# Patient Record
Sex: Female | Born: 1965 | Race: White | Hispanic: No | Marital: Married | Smoking: Never smoker
Health system: Southern US, Community
[De-identification: ages and names within clinical notes are randomized; demographics above are authoritative.]

## PROBLEM LIST (undated history)

## (undated) DIAGNOSIS — T7840XA Allergy, unspecified, initial encounter: Secondary | ICD-10-CM

## (undated) DIAGNOSIS — E785 Hyperlipidemia, unspecified: Secondary | ICD-10-CM

## (undated) DIAGNOSIS — E236 Other disorders of pituitary gland: Secondary | ICD-10-CM

## (undated) DIAGNOSIS — G43909 Migraine, unspecified, not intractable, without status migrainosus: Secondary | ICD-10-CM

## (undated) DIAGNOSIS — J45909 Unspecified asthma, uncomplicated: Secondary | ICD-10-CM

## (undated) HISTORY — PX: ROTATOR CUFF REPAIR: SHX139

## (undated) HISTORY — DX: Unspecified asthma, uncomplicated: J45.909

## (undated) HISTORY — DX: Allergy, unspecified, initial encounter: T78.40XA

## (undated) HISTORY — DX: Hyperlipidemia, unspecified: E78.5

## (undated) HISTORY — PX: APPENDECTOMY: SHX54

## (undated) HISTORY — PX: EYE SURGERY: SHX253

## (undated) HISTORY — PX: ABDOMINAL HYSTERECTOMY: SHX81

---
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
## 2004-01-23 ENCOUNTER — Other Ambulatory Visit: Payer: Self-pay

## 2004-07-30 ENCOUNTER — Ambulatory Visit: Payer: Self-pay | Admitting: Endocrinology

## 2006-04-03 ENCOUNTER — Ambulatory Visit: Payer: Self-pay | Admitting: Family Medicine

## 2006-04-03 LAB — HM DEXA SCAN

## 2006-07-30 ENCOUNTER — Ambulatory Visit: Payer: Self-pay | Admitting: General Surgery

## 2007-04-09 ENCOUNTER — Ambulatory Visit: Payer: Self-pay | Admitting: Family Medicine

## 2008-04-12 ENCOUNTER — Ambulatory Visit: Payer: Self-pay

## 2008-08-16 ENCOUNTER — Ambulatory Visit: Payer: Self-pay | Admitting: Unknown Physician Specialty

## 2009-07-06 ENCOUNTER — Ambulatory Visit: Payer: Self-pay | Admitting: Family Medicine

## 2010-07-18 ENCOUNTER — Ambulatory Visit: Payer: Self-pay | Admitting: Family Medicine

## 2011-02-18 ENCOUNTER — Ambulatory Visit: Payer: Self-pay | Admitting: General Practice

## 2011-04-28 ENCOUNTER — Observation Stay: Payer: Self-pay | Admitting: Internal Medicine

## 2011-04-28 DIAGNOSIS — G459 Transient cerebral ischemic attack, unspecified: Secondary | ICD-10-CM

## 2012-05-19 ENCOUNTER — Ambulatory Visit: Payer: Self-pay | Admitting: Family Medicine

## 2012-05-19 LAB — HM MAMMOGRAPHY

## 2012-06-04 LAB — HM PAP SMEAR

## 2014-12-12 ENCOUNTER — Encounter: Payer: Self-pay | Admitting: Emergency Medicine

## 2014-12-12 ENCOUNTER — Emergency Department
Admission: EM | Admit: 2014-12-12 | Discharge: 2014-12-12 | Disposition: A | Discharge status: Home / Self Care | Payer: No Typology Code available for payment source | Attending: Emergency Medicine | Admitting: Emergency Medicine

## 2014-12-12 ENCOUNTER — Emergency Department: Payer: No Typology Code available for payment source

## 2014-12-12 DIAGNOSIS — R079 Chest pain, unspecified: Secondary | ICD-10-CM | POA: Insufficient documentation

## 2014-12-12 DIAGNOSIS — Z792 Long term (current) use of antibiotics: Secondary | ICD-10-CM | POA: Diagnosis not present

## 2014-12-12 DIAGNOSIS — R202 Paresthesia of skin: Secondary | ICD-10-CM | POA: Diagnosis not present

## 2014-12-12 DIAGNOSIS — R51 Headache: Secondary | ICD-10-CM | POA: Diagnosis not present

## 2014-12-12 DIAGNOSIS — R519 Headache, unspecified: Secondary | ICD-10-CM

## 2014-12-12 DIAGNOSIS — R110 Nausea: Secondary | ICD-10-CM | POA: Insufficient documentation

## 2014-12-12 DIAGNOSIS — R002 Palpitations: Secondary | ICD-10-CM | POA: Insufficient documentation

## 2014-12-12 DIAGNOSIS — Z79899 Other long term (current) drug therapy: Secondary | ICD-10-CM | POA: Insufficient documentation

## 2014-12-12 HISTORY — DX: Migraine, unspecified, not intractable, without status migrainosus: G43.909

## 2014-12-12 HISTORY — DX: Other disorders of pituitary gland: E23.6

## 2014-12-12 LAB — CBC
HCT: 42.4 % (ref 35.0–47.0)
Hemoglobin: 14.2 g/dL (ref 12.0–16.0)
MCH: 28.5 pg (ref 26.0–34.0)
MCHC: 33.5 g/dL (ref 32.0–36.0)
MCV: 85 fL (ref 80.0–100.0)
Platelets: 241 10*3/uL (ref 150–440)
RBC: 4.99 MIL/uL (ref 3.80–5.20)
RDW: 13.3 % (ref 11.5–14.5)
WBC: 7 10*3/uL (ref 3.6–11.0)

## 2014-12-12 LAB — BASIC METABOLIC PANEL
Anion gap: 6 (ref 5–15)
BUN: 19 mg/dL (ref 6–20)
CALCIUM: 9.2 mg/dL (ref 8.9–10.3)
CO2: 24 mmol/L (ref 22–32)
CREATININE: 0.94 mg/dL (ref 0.44–1.00)
Chloride: 109 mmol/L (ref 101–111)
GFR calc non Af Amer: >60 mL/min (ref >60)
GLUCOSE: 114 mg/dL — AB (ref 65–99)
Potassium: 3.4 mmol/L — ABNORMAL LOW (ref 3.5–5.1)
Sodium: 139 mmol/L (ref 135–145)

## 2014-12-12 LAB — MAGNESIUM: Magnesium: 2 mg/dL (ref 1.7–2.4)

## 2014-12-12 LAB — TROPONIN I: Troponin I: <0.03 ng/mL (ref <0.031)

## 2014-12-12 MED ORDER — BUTALBITAL-APAP-CAFFEINE 50-325-40 MG PO TABS
2.0000 | ORAL_TABLET | Freq: Once | ORAL | Status: AC
  Administered 2014-12-12: 2 via ORAL

## 2014-12-12 MED ORDER — SODIUM CHLORIDE 0.9 % IV BOLUS (SEPSIS)
1000.0000 mL | Freq: Once | INTRAVENOUS | Status: AC
  Administered 2014-12-12: 1000 mL via INTRAVENOUS

## 2014-12-12 MED ORDER — METOCLOPRAMIDE HCL 5 MG/ML IJ SOLN
INTRAMUSCULAR | Status: AC
  Administered 2014-12-12: 10 mg via INTRAVENOUS
  Filled 2014-12-12: qty 2

## 2014-12-12 MED ORDER — BUTALBITAL-APAP-CAFFEINE 50-325-40 MG PO TABS
ORAL_TABLET | ORAL | Status: AC
  Administered 2014-12-12: 2 via ORAL
  Filled 2014-12-12: qty 2

## 2014-12-12 MED ORDER — METOCLOPRAMIDE HCL 5 MG/ML IJ SOLN
10.0000 mg | Freq: Once | INTRAMUSCULAR | Status: AC
  Administered 2014-12-12: 10 mg via INTRAVENOUS

## 2014-12-12 NOTE — ED Notes (Signed)
Pt uprite on stretcher in exam room with no distress noted; pt reports lying in bed approx 2hrs PTA and had sudden onset heart racing, SOB, nausea, tingling to hands, "burning" sensation to mid chest radiating up into throat; denies hx of same; reports recent air trip to [place] and began having ear pain for which she was rx amoxicillin for on Thursday

## 2014-12-12 NOTE — ED Notes (Addendum)
Med admin for nausea as ordered & per pt request for c/o; reports HA now decreases 4/10

## 2014-12-12 NOTE — ED Notes (Signed)
Pt says about 2 hours ago she was laying in bed when she began having palpitations, nausea, hand tingling; diaphoretic but not at this time; pt says "I just don't feel well"; returned from vacation today; taking antibiotics for ear infection;

## 2014-12-12 NOTE — Discharge Instructions (Signed)
General Headache Without Cause A headache is pain or discomfort felt around the head or neck area. The specific cause of a headache may not be found. There are many causes and types of headaches. A few common ones are:  Tension headaches.  Migraine headaches.  Cluster headaches.  Chronic daily headaches. HOME CARE INSTRUCTIONS   Keep all follow-up appointments with your caregiver or any specialist referral.  Only take over-the-counter or prescription medicines for pain or discomfort as directed by your caregiver.  Lie down in a dark, quiet room when you have a headache.  Keep a headache journal to find out what may trigger your migraine headaches. For example, write down:  What you eat and drink.  How much sleep you get.  Any change to your diet or medicines.  Try massage or other relaxation techniques.  Put ice packs or heat on the head and neck. Use these 3 to 4 times per day for 15 to 20 minutes each time, or as needed.  Limit stress.  Sit up straight, and do not tense your muscles.  Quit smoking if you smoke.  Limit alcohol use.  Decrease the amount of caffeine you drink, or stop drinking caffeine.  Eat and sleep on a regular schedule.  Get 7 to 9 hours of sleep, or as recommended by your caregiver.  Keep lights dim if bright lights bother you and make your headaches worse. SEEK MEDICAL CARE IF:   You have problems with the medicines you were prescribed.  Your medicines are not working.  You have a change from the usual headache.  You have nausea or vomiting. SEEK IMMEDIATE MEDICAL CARE IF:   Your headache becomes severe.  You have a fever.  You have a stiff neck.  You have loss of vision.  You have muscular weakness or loss of muscle control.  You start losing your balance or have trouble walking.  You feel faint or pass out.  You have severe symptoms that are different from your first symptoms. MAKE SURE YOU:   Understand these  instructions.  Will watch your condition.  Will get help right away if you are not doing well or get worse. Document Released: 06/10/2005 Document Revised: 09/02/2011 Document Reviewed: 06/26/2011 [facility] Patient Information 2015 [place], [place]. This information is not intended to replace advice given to you by your health care provider. Make sure you discuss any questions you have with your health care provider.  Palpitations A palpitation is the feeling that your heartbeat is irregular or is faster than normal. It may feel like your heart is fluttering or skipping a beat. Palpitations are usually not a serious problem. However, in some cases, you may need further medical evaluation. CAUSES  Palpitations can be caused by:  Smoking.  Caffeine or other stimulants, such as diet pills or energy drinks.  Alcohol.  Stress and anxiety.  Strenuous physical activity.  Fatigue.  Certain medicines.  Heart disease, especially if you have a history of irregular heart rhythms (arrhythmias), such as atrial fibrillation, atrial flutter, or supraventricular tachycardia.  An improperly working pacemaker or defibrillator. DIAGNOSIS  To find the cause of your palpitations, your health care provider will take your medical history and perform a physical exam. Your health care provider may also have you take a test called an ambulatory electrocardiogram (ECG). An ECG records your heartbeat patterns over a 24-hour period. You may also have other tests, such as:  Transthoracic echocardiogram (TTE). During echocardiography, sound waves are used to evaluate  how blood flows through your heart.  Transesophageal echocardiogram (TEE).  Cardiac monitoring. This allows your health care provider to monitor your heart rate and rhythm in real time.  Holter monitor. This is a portable device that records your heartbeat and can help diagnose heart arrhythmias. It allows your health care provider to track your  heart activity for several days, if needed.  Stress tests by exercise or by giving medicine that makes the heart beat faster. TREATMENT  Treatment of palpitations depends on the cause of your symptoms and can vary greatly. Most cases of palpitations do not require any treatment other than time, relaxation, and monitoring your symptoms. Other causes, such as atrial fibrillation, atrial flutter, or supraventricular tachycardia, usually require further treatment. HOME CARE INSTRUCTIONS   Avoid:  Caffeinated coffee, tea, soft drinks, diet pills, and energy drinks.  Chocolate.  Alcohol.  Stop smoking if you smoke.  Reduce your stress and anxiety. Things that can help you relax include:  A method of controlling things in your body, such as your heartbeats, with your mind (biofeedback).  Yoga.  Meditation.  Physical activity such as swimming, jogging, or walking.  Get plenty of rest and sleep. SEEK MEDICAL CARE IF:   You continue to have a fast or irregular heartbeat beyond 24 hours.  Your palpitations occur more often. SEEK IMMEDIATE MEDICAL CARE IF:  You have chest pain or shortness of breath.  You have a severe headache.  You feel dizzy or you faint. MAKE SURE YOU:  Understand these instructions.  Will watch your condition.  Will get help right away if you are not doing well or get worse. Document Released: 06/07/2000 Document Revised: 06/15/2013 Document Reviewed: 08/09/2011 [facility] Patient Information 2015 [place], [place]. This information is not intended to replace advice given to you by your health care provider. Make sure you discuss any questions you have with your health care provider.  Paresthesia Paresthesia is an abnormal burning or prickling sensation. This sensation is generally felt in the hands, arms, legs, or feet. However, it may occur in any part of the body. It is usually not painful. The feeling may be described as:  Tingling or numbness.  "Pins  and needles."  Skin crawling.  Buzzing.  Limbs "falling asleep."  Itching. Most people experience temporary (transient) paresthesia at some time in their lives. CAUSES  Paresthesia may occur when you breathe too quickly (hyperventilation). It can also occur without any apparent cause. Commonly, paresthesia occurs when pressure is placed on a nerve. The feeling quickly goes away once the pressure is removed. For some people, however, paresthesia is a long-lasting (chronic) condition caused by an underlying disorder. The underlying disorder may be:  A traumatic, direct injury to nerves. Examples include a:  Broken (fractured) neck.  Fractured skull.  A disorder affecting the brain and spinal cord (central nervous system). Examples include:  Transverse myelitis.  Encephalitis.  Transient ischemic attack.  Multiple sclerosis.  Stroke.  Tumor or blood vessel problems, such as an arteriovenous malformation pressing against the brain or spinal cord.  A condition that damages the peripheral nerves (peripheral neuropathy). Peripheral nerves are not part of the brain and spinal cord. These conditions include:  Diabetes.  Peripheral vascular disease.  Nerve entrapment syndromes, such as carpal tunnel syndrome.  Shingles.  Hypothyroidism.  Vitamin B12 deficiencies.  Alcoholism.  Heavy metal poisoning (lead, arsenic).  Rheumatoid arthritis.  Systemic lupus erythematosus. DIAGNOSIS  Your caregiver will attempt to find the underlying cause of your paresthesia. Your  caregiver may:  Take your medical history.  Perform a physical exam.  Order various lab tests.  Order imaging tests. TREATMENT  Treatment for paresthesia depends on the underlying cause. HOME CARE INSTRUCTIONS  Avoid drinking alcohol.  You may consider massage or acupuncture to help relieve your symptoms.  Keep all follow-up appointments as directed by your caregiver. SEEK IMMEDIATE MEDICAL CARE  IF:   You feel weak.  You have trouble walking or moving.  You have problems with speech or vision.  You feel confused.  You cannot control your bladder or bowel movements.  You feel numbness after an injury.  You faint.  Your burning or prickling feeling gets worse when walking.  You have pain, cramps, or dizziness.  You develop a rash. MAKE SURE YOU:  Understand these instructions.  Will watch your condition.  Will get help right away if you are not doing well or get worse. Document Released: 05/31/2002 Document Revised: 09/02/2011 Document Reviewed: 03/01/2011 [facility] Patient Information 2015 [place], [place]. This information is not intended to replace advice given to you by your health care provider. Make sure you discuss any questions you have with your health care provider.

## 2014-12-12 NOTE — ED Notes (Signed)
Pt reports frontal HA 7/10; MD aware and med admin as ordered

## 2014-12-12 NOTE — ED Provider Notes (Signed)
[facility] Emergency Department Provider Note  ____________________________________________  Time seen: Approximately 207 AM  I have reviewed the triage vital signs and the nursing notes.   HISTORY  Chief Complaint Palpitations; Nausea; Chest Pain; and Tingling    HPI Joanne Wade is a 49 y.o. female who started having palpitations at 2300. The patient reports that she was in bed when she started not feeling well. She reports that she started having some palpitations and felt as though her heart was beating hard. She reports that she tried to do yoga breathing but it did not help the palpitations improved. She reports that she felt lightheaded and nauseous and dizzy. She was diagnosed with ear infections and has had some ear pain on antibiotics for the last 4 days. The patient reports that she also started having some tingling in her arms and hands. She reports that that is typical of or that she used to have with migraines but she has not had migraine in a long time and she did not develop a headache. She reports that she feels as though she had an allergic reaction but is unsure. The patient reports that she did have some chest pain as well. She reports that her hands and arms to go numb for a short period of time. She reports she has been taking Advil for her ear pain. She reports that she does feel better now but still does have some tingling in her hands. She reports that her chest pain feels that some mild indigestion but her palpitations are improved. She does have some shortness of breath. The patient was recently in [place] and returned today by airplane.   Past Medical History  Diagnosis Date  . Migraine   . 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There are no active problems to display for this patient.   Past Surgical History  Procedure Laterality Date  . Abdominal hysterectomy    . Rotator cuff repair    . Eye surgery      Current Outpatient Rx  Name  Route   Sig  Dispense  Refill  . amoxicillin (AMOXIL) 875 MG tablet   Oral   Take 875 mg by mouth 2 (two) times daily.         . montelukast (SINGULAIR) 10 MG tablet   Oral   Take 10 mg by mouth at bedtime.         . pseudoephedrine (SUDAFED) 120 MG 12 hr tablet   Oral   Take 120 mg by mouth 2 (two) times daily.           Allergies Morphine and related  History reviewed. No pertinent family history.  Social History History  Substance Use Topics  . Smoking status: Never Smoker   . Smokeless tobacco: Never Used  . Alcohol Use: Yes    Review of Systems Constitutional: No fever/chills Eyes: No visual changes. ENT: No sore throat. Cardiovascular:  chest pain, palpitations Respiratory:  shortness of breath. Gastrointestinal: No abdominal pain.  No nausea, no vomiting.   Genitourinary: Negative for dysuria. Musculoskeletal: Negative for back pain. Skin: Negative for rash. Neurological: Tingling and numbness of bilateral hands  10-point ROS otherwise negative.  ____________________________________________   PHYSICAL EXAM:  VITAL SIGNS: ED Triage Vitals  Enc Vitals Group     BP 12/12/14 0052 124/110 mmHg     Pulse Rate 12/12/14 0052 105     Resp 12/12/14 0052 29     Temp 12/12/14 0055 97.8 F (  36.6 C)     Temp Source 12/12/14 0055 Oral     SpO2 12/12/14 0052 96 %     Weight 12/12/14 0052 110 lb (49.896 kg)     Height 12/12/14 0052 5\' 1"  (1.549 m)     Head Cir --      Peak Flow --      Pain Score 12/12/14 0058 6     Pain Loc --      Pain Edu? --      Excl. in [place]? --     Constitutional: Alert and oriented. Well appearing and in mild distress. Eyes: Conjunctivae are normal. PERRL. EOMI. Head: Atraumatic. Ears: Erythema to bilateral TMs with some fluid and blood behind TMs bilaterally Nose: No congestion/rhinnorhea. Mouth/Throat: Mucous membranes are moist.  Oropharynx non-erythematous. Cardiovascular: Normal rate, regular rhythm. Grossly normal heart sounds.   Good peripheral circulation. Respiratory: Normal respiratory effort.  No retractions. Lungs CTAB. Gastrointestinal: Soft and nontender. No distention. Positive bowel sounds Genitourinary: Deferred Musculoskeletal: No lower extremity tenderness nor edema.  No joint effusions. Neurologic:  Normal speech and language. No gross focal neurologic deficits are appreciated. Cranial nerves II through XII grossly intact Skin:  Skin is warm, dry and intact. No rash noted. Psychiatric: Mood and affect are normal.  ____________________________________________   LABS (all labs ordered are listed, but only abnormal results are displayed)  Labs Reviewed  BASIC METABOLIC PANEL - Abnormal; Notable for the following:    Potassium 3.4 (*)    Glucose, Bld 114 (*)    All other components within normal limits  CBC  TROPONIN I  TROPONIN I  MAGNESIUM   ____________________________________________  EKG  ED ECG REPORT I, Loney Hering, the attending physician, personally viewed and interpreted this ECG.   Date: 12/12/2014  EKG Time: 15  Rate: 76  Rhythm: normal sinus rhythm  Axis: Normal  Intervals:none  ST&T Change: None  ____________________________________________  RADIOLOGY  CT head: Unremarkable noncontrast CT of the head ____________________________________________   PROCEDURES  Procedure(s) performed: None  Critical Care performed: No  ____________________________________________   INITIAL IMPRESSION / ASSESSMENT AND PLAN / ED COURSE  Pertinent labs & imaging results that were available during my care of the patient were reviewed by me and considered in my medical decision making (see chart for details).  This is a 49 year old female who comes in with symptoms of palpitations, dizziness and numbness and tingling of her bilateral arms. 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 I will give the patient some fluid as well.  ----------------------------------------- 6:16 AM on 12/12/2014 -----------------------------------------  When I did evaluate the patient again she was receiving her fluids and reports that she did have a headache. I wrote for the patient to get some Fioricet. The patient became anxious and asked for nausea medicine and I did give her some Zofran. The patient discharged home to follow-up with her primary care physician. She reports that the palpitations and the tingling is all improved. ____________________________________________   FINAL CLINICAL IMPRESSION(S) / ED DIAGNOSES  Final diagnoses:  Palpitations  Paresthesia  Acute nonintractable headache, unspecified headache type      Loney Hering, MD 12/12/14 [phone]

## 2014-12-31 DIAGNOSIS — J45909 Unspecified asthma, uncomplicated: Secondary | ICD-10-CM | POA: Insufficient documentation

## 2014-12-31 DIAGNOSIS — G930 Cerebral cysts: Secondary | ICD-10-CM

## 2014-12-31 DIAGNOSIS — IMO0002 Reserved for concepts with insufficient information to code with codable children: Secondary | ICD-10-CM | POA: Insufficient documentation

## 2014-12-31 DIAGNOSIS — R760 Raised antibody titer: Secondary | ICD-10-CM | POA: Insufficient documentation

## 2014-12-31 DIAGNOSIS — G459 Transient cerebral ischemic attack, unspecified: Secondary | ICD-10-CM

## 2014-12-31 DIAGNOSIS — E785 Hyperlipidemia, unspecified: Secondary | ICD-10-CM | POA: Insufficient documentation

## 2014-12-31 DIAGNOSIS — J309 Allergic rhinitis, unspecified: Secondary | ICD-10-CM | POA: Insufficient documentation

## 2014-12-31 DIAGNOSIS — F419 Anxiety disorder, unspecified: Secondary | ICD-10-CM | POA: Insufficient documentation

## 2015-01-12 ENCOUNTER — Ambulatory Visit: Payer: Self-pay | Admitting: Family Medicine

## 2015-03-10 DIAGNOSIS — Z8669 Personal history of other diseases of the nervous system and sense organs: Secondary | ICD-10-CM | POA: Insufficient documentation

## 2015-03-10 DIAGNOSIS — F419 Anxiety disorder, unspecified: Secondary | ICD-10-CM | POA: Insufficient documentation

## 2015-03-10 DIAGNOSIS — R1032 Left lower quadrant pain: Secondary | ICD-10-CM | POA: Insufficient documentation

## 2015-03-10 DIAGNOSIS — E785 Hyperlipidemia, unspecified: Secondary | ICD-10-CM | POA: Insufficient documentation

## 2015-03-10 DIAGNOSIS — R635 Abnormal weight gain: Secondary | ICD-10-CM | POA: Insufficient documentation

## 2015-03-10 DIAGNOSIS — J45909 Unspecified asthma, uncomplicated: Secondary | ICD-10-CM | POA: Insufficient documentation

## 2015-03-10 DIAGNOSIS — R319 Hematuria, unspecified: Secondary | ICD-10-CM | POA: Insufficient documentation

## 2015-03-10 DIAGNOSIS — K5900 Constipation, unspecified: Secondary | ICD-10-CM | POA: Insufficient documentation

## 2015-03-13 ENCOUNTER — Encounter: Payer: Self-pay | Admitting: Family Medicine

## 2015-03-13 ENCOUNTER — Ambulatory Visit (INDEPENDENT_AMBULATORY_CARE_PROVIDER_SITE_OTHER): Payer: PRIVATE HEALTH INSURANCE | Admitting: Family Medicine

## 2015-03-13 VITALS — BP 120/70 | HR 64 | Temp 98.3°F | Resp 16 | Ht 66.0 in | Wt 153.0 lb

## 2015-03-13 DIAGNOSIS — H6982 Other specified disorders of Eustachian tube, left ear: Secondary | ICD-10-CM

## 2015-03-13 DIAGNOSIS — Z0000 Encounter for general adult medical examination without abnormal findings: Secondary | ICD-10-CM | POA: Diagnosis not present

## 2015-03-13 LAB — POCT URINALYSIS DIPSTICK
Bilirubin, UA: NEGATIVE
Blood, UA: NEGATIVE
Glucose, UA: NEGATIVE
Ketones, UA: NEGATIVE
LEUKOCYTES UA: NEGATIVE
NITRITE UA: NEGATIVE
PH UA: 6
PROTEIN UA: NEGATIVE
Spec Grav, UA: 1.01
Urobilinogen, UA: 0.2

## 2015-03-13 MED ORDER — MONTELUKAST SODIUM 10 MG PO TABS: 10.0000 mg | ORAL_TABLET | Freq: Every day | ORAL | Status: DC

## 2015-03-13 MED ORDER — FLUTICASONE PROPIONATE 50 MCG/ACT NA SUSP: 2.0000 | Freq: Every day | NASAL | Status: DC

## 2015-03-13 NOTE — Progress Notes (Signed)
Patient ID: Joanne Wade, female   DOB: [birth year], 49 y.o.   MRN: [id number]        Patient: Joanne Wade, Female    DOB: 11-13-1965, 49 y.o.   MRN: [id number] Visit Date: 03/13/2015  Today's Provider: Margarita Rana, MD   Chief Complaint  Patient presents with  . Annual Exam   Subjective:    Annual physical exam Joanne Wade is a 49 y.o. female who presents today for health maintenance and complete physical. She feels well. She reports exercising once a week. She reports she is sleeping fairly well, due to work.  Also flying a lot with new boyfriend and having trouble with ear pain on decent.      06/04/12 CPE, 07/2014 GYN in [place] 07/2014 Pap-neg; HPV-neg per pt 0/2016 Mammogram-Normal per pt 04/03/06 BMD-Osteopenia, recheck in 2 years 04/24/11 EKG  Results for orders placed or performed in visit on 03/13/15  POCT urinalysis dipstick  Result Value Ref Range   Color, UA straw    Clarity, UA clear    Glucose, UA neg    Bilirubin, UA neg    Ketones, UA neg    Spec Grav, UA 1.010    Blood, UA neg    pH, UA 6.0    Protein, UA neg    Urobilinogen, UA 0.2    Nitrite, UA neg    Leukocytes, UA Negative Negative     Lab Results  Component Value Date   WBC 7.0 12/12/2014   HGB 14.2 12/12/2014   HCT 42.4 12/12/2014   PLT 241 12/12/2014   GLUCOSE 114* 12/12/2014   NA 139 12/12/2014   K 3.4* 12/12/2014   CL 109 12/12/2014   CREATININE 0.94 12/12/2014   BUN 19 12/12/2014   CO2 24 12/12/2014    Review of Systems  Constitutional: Negative.   HENT: Positive for ear pain and sinus pressure.   Eyes: Negative.   Respiratory: Negative.   Cardiovascular: Negative.   Gastrointestinal: Positive for constipation.  Endocrine: Negative.   Genitourinary: Positive for frequency.  Musculoskeletal: Negative.   Skin: Negative.   Allergic/Immunologic: Positive for food allergies.  Neurological: Positive for headaches.  Hematological: Negative.     Psychiatric/Behavioral: Negative.     Social History She  reports that she has never smoked. She has never used smokeless tobacco. She reports that she drinks alcohol. She reports that she does not use illicit drugs. Social History   Social History  . Marital Status: Single    Spouse Name: N/A  . Number of Children: 2  . Years of Education: N/A   Social History Main Topics  . Smoking status: Never Smoker   . Smokeless tobacco: Never Used  . Alcohol Use: Yes     Comment: ocassional  . Drug Use: No  . Sexual Activity: Not Asked   Other Topics Concern  . None   Social History Narrative    Patient Active Problem List   Diagnosis Date Noted  . Anxiety 03/10/2015  . 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 CN (constipation) 03/10/2015  . Blood in the urine 03/10/2015  . Personal history of disorder of nervous system and sense organs 03/10/2015  . HLD (hyperlipidemia) 03/10/2015  . Abdominal pain, left lower quadrant 03/10/2015  . Abnormal weight gain 03/10/2015  . 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 Asthma 12/31/2014  . 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 TIA (transient ischemic attack) 12/31/2014  . Allergic rhinitis 12/31/2014  . Hyperlipidemia 12/31/2014  . Acute anxiety  12/31/2014  . Abnormal cervical Pap smear with positive HPV DNA test 12/31/2014    Past Surgical History  Procedure Laterality Date  . Rotator cuff repair    . Appendectomy    . Tonsillectomy and adenoidectomy  1972  . Abdominal hysterectomy      still has cervix  . Eye surgery      Family History  Family Status  Relation Status Death Age  . Mother Deceased 22  . Father Alive    Her family history includes Alcohol abuse in her mother; Cancer in her father and mother; Hypertension in her mother; Melanoma in her mother.    Allergies  Allergen Reactions  . Morphine Other (See Comments)    Other Reaction: Other reaction  . Belladonna Alkaloids   . Morphine And Related Itching     Previous Medications   MONTELUKAST (SINGULAIR) 10 MG TABLET    Take 10 mg by mouth at bedtime.    Patient Care Team: Margarita Rana, MD as PCP - General (Family Medicine)     Objective:   Vitals: BP 120/70 mmHg  Pulse 64  Temp(Src) 98.3 F (36.8 C) (Oral)  Resp 16  Ht 5\' 6"  (1.676 m)  Wt 153 lb (69.4 kg)  BMI 24.71 kg/m2   Physical Exam  Constitutional: She is oriented to person, place, and time. She appears well-developed and well-nourished.  HENT:  Head: Normocephalic and atraumatic.  Right Ear: Tympanic membrane, external ear and ear canal normal.  Left Ear: Tympanic membrane, external ear and ear canal normal.  Nose: Nose normal.  Mouth/Throat: Uvula is midline, oropharynx is clear and moist and mucous membranes are normal.  Eyes: Conjunctivae, EOM and lids are normal. Pupils are equal, round, and reactive to light.  Neck: Trachea normal and normal range of motion. Neck supple. Carotid bruit is not present. No thyroid mass and no thyromegaly present.  Cardiovascular: Normal rate, regular rhythm and normal heart sounds.   Pulmonary/Chest: Effort normal and breath sounds normal.  Abdominal: Soft. Normal appearance and bowel sounds are normal. There is no hepatosplenomegaly. There is no tenderness.  Genitourinary: No breast swelling, tenderness or discharge.  Musculoskeletal: Normal range of motion.  Lymphadenopathy:    She has no cervical adenopathy.    She has no axillary adenopathy.  Neurological: She is alert and oriented to person, place, and time. She has normal strength. No cranial nerve deficit.  Skin: Skin is warm, dry and intact.  Psychiatric: She has a normal mood and affect. Her speech is normal and behavior is normal. Judgment and thought content normal. Cognition and memory are normal.     Depression Screen PHQ 2/9 Scores 03/13/2015  PHQ - 2 Score 0      Assessment & Plan:     Routine Health Maintenance and Physical Exam  Exercise Activities  and Dietary recommendations Goals    . Exercise 150 minutes per week (moderate activity)        There is no immunization history on file for this patient.  Health Maintenance  Topic Date Due  . HIV Screening  01/10/1982  . TETANUS/TDAP  01/10/1986  . INFLUENZA VACCINE  01/23/2015  . PAP SMEAR  06/05/2015   1. Annual physical exam Continue to eat healthy and exercise.  Bring in labs to review.    - POCT urinalysis dipstick - Comprehensive metabolic panel  2. Eustachian tube dysfunction, left Condition is worsening. Will start medication for better control.  Also use Afrin before flight only if below  does not work.   - fluticasone (FLONASE) 50 MCG/ACT nasal spray; Place 2 sprays into both nostrils daily.  Dispense: 16 g; Refill: 6 - montelukast (SINGULAIR) 10 MG tablet; Take 1 tablet (10 mg total) by mouth at bedtime.  Dispense: 90 tablet; Refill: 2    Patient was seen and examined by Jerrell Belfast, MD, and note scribed by Lynford Haller, [place].   I have reviewed the document for accuracy and completeness and I agree with above. Jerrell Belfast, MD  --------------------------------------------------------------------

## 2015-03-13 NOTE — Patient Instructions (Signed)
Use Afrin as needed.

## 2015-05-23 ENCOUNTER — Ambulatory Visit: Payer: Self-pay | Admitting: Physician Assistant

## 2015-05-23 DIAGNOSIS — Z299 Encounter for prophylactic measures, unspecified: Secondary | ICD-10-CM

## 2015-05-23 NOTE — Progress Notes (Signed)
Patient ID: Joanne Wade, female   DOB: April 11, 1966, 49 y.o.   MRN: [id number] Patient came in to have flu shot done here in the clinic.  Patient stayed for 10 minutes and had no adverse reaction.

## 2015-08-18 ENCOUNTER — Other Ambulatory Visit: Payer: Self-pay | Admitting: Family Medicine

## 2015-08-18 DIAGNOSIS — Z1231 Encounter for screening mammogram for malignant neoplasm of breast: Secondary | ICD-10-CM

## 2015-09-21 ENCOUNTER — Ambulatory Visit
Admission: RE | Admit: 2015-09-21 | Discharge: 2015-09-21 | Disposition: A | Discharge status: Home / Self Care | Payer: Managed Care, Other (non HMO) | Source: Ambulatory Visit | Attending: Family Medicine | Admitting: Family Medicine

## 2015-09-21 DIAGNOSIS — Z1231 Encounter for screening mammogram for malignant neoplasm of breast: Secondary | ICD-10-CM | POA: Insufficient documentation

## 2015-09-26 ENCOUNTER — Encounter: Payer: Self-pay | Admitting: Physician Assistant

## 2015-09-26 ENCOUNTER — Ambulatory Visit: Payer: Self-pay | Admitting: Physician Assistant

## 2015-09-26 VITALS — BP 100/60 | HR 68 | Temp 97.9°F

## 2015-09-26 DIAGNOSIS — R5383 Other fatigue: Secondary | ICD-10-CM

## 2015-09-26 NOTE — Progress Notes (Signed)
S: c/o being tired, fatigued, + weight gain, states is really stressed at work, not sleeping well, has been eating the right things, feels hungry a lot and is urinating a lot, remainder ros neg,   O: vitals wnl, nad, lungs c t a, cv rrr, thyroid smooth no nodules noted  A: fatigue  P: fasting labs on Friday, pt to watch her diet to make sure she is eating enough, try melatonin for sleep

## 2015-09-29 ENCOUNTER — Encounter (INDEPENDENT_AMBULATORY_CARE_PROVIDER_SITE_OTHER): Payer: Self-pay

## 2015-09-29 ENCOUNTER — Other Ambulatory Visit: Payer: Self-pay

## 2015-09-29 DIAGNOSIS — Z0000 Encounter for general adult medical examination without abnormal findings: Secondary | ICD-10-CM

## 2015-09-29 NOTE — Progress Notes (Signed)
Patient came in to have blood drawn per Susan's orders.  Blood was drawn from the right arm without any incident.

## 2015-09-30 LAB — CMP12+LP+TP+TSH+6AC+CBC/D/PLT
ALBUMIN: 4.5 g/dL (ref 3.5–5.5)
ALK PHOS: 70 IU/L (ref 39–117)
ALT: 11 IU/L (ref 0–32)
AST: 16 IU/L (ref 0–40)
Albumin/Globulin Ratio: 1.8 (ref 1.2–2.2)
BUN/Creatinine Ratio: 16 (ref 9–23)
BUN: 17 mg/dL (ref 6–24)
Basophils Absolute: 0 10*3/uL (ref 0.0–0.2)
Basos: 1 %
Bilirubin Total: 0.4 mg/dL (ref 0.0–1.2)
CALCIUM: 9 mg/dL (ref 8.7–10.2)
CHOLESTEROL TOTAL: 228 mg/dL — AB (ref 100–199)
Chloride: 103 mmol/L (ref 96–106)
Chol/HDL Ratio: 3.6 ratio units (ref 0.0–4.4)
Creatinine, Ser: 1.06 mg/dL — ABNORMAL HIGH (ref 0.57–1.00)
EOS (ABSOLUTE): 0.2 10*3/uL (ref 0.0–0.4)
Eos: 4 %
Estimated CHD Risk: 0.6 times avg. (ref 0.0–1.0)
FREE THYROXINE INDEX: 2.4 (ref 1.2–4.9)
GFR calc Af Amer: 72 mL/min/{1.73_m2} (ref >59)
GFR calc non Af Amer: 62 mL/min/{1.73_m2} (ref >59)
GGT: 12 IU/L (ref 0–60)
GLOBULIN, TOTAL: 2.5 g/dL (ref 1.5–4.5)
Glucose: 93 mg/dL (ref 65–99)
HDL: 63 mg/dL (ref >39)
Hematocrit: 41.8 % (ref 34.0–46.6)
Hemoglobin: 13.7 g/dL (ref 11.1–15.9)
IMMATURE GRANS (ABS): 0 10*3/uL (ref 0.0–0.1)
IMMATURE GRANULOCYTES: 0 %
IRON: 89 ug/dL (ref 27–159)
LDH: 192 IU/L (ref 119–226)
LDL Calculated: 151 mg/dL — ABNORMAL HIGH (ref 0–99)
LYMPHS: 48 %
Lymphocytes Absolute: 2.6 10*3/uL (ref 0.7–3.1)
MCH: 28.8 pg (ref 26.6–33.0)
MCHC: 32.8 g/dL (ref 31.5–35.7)
MCV: 88 fL (ref 79–97)
MONOS ABS: 0.4 10*3/uL (ref 0.1–0.9)
Monocytes: 8 %
NEUTROS PCT: 39 %
Neutrophils Absolute: 2 10*3/uL (ref 1.4–7.0)
PHOSPHORUS: 3.6 mg/dL (ref 2.5–4.5)
POTASSIUM: 4.3 mmol/L (ref 3.5–5.2)
Platelets: 277 10*3/uL (ref 150–379)
RBC: 4.76 x10E6/uL (ref 3.77–5.28)
RDW: 13.3 % (ref 12.3–15.4)
Sodium: 141 mmol/L (ref 134–144)
T3 UPTAKE RATIO: 31 % (ref 24–39)
T4 TOTAL: 7.6 ug/dL (ref 4.5–12.0)
TRIGLYCERIDES: 71 mg/dL (ref 0–149)
TSH: 1.75 u[IU]/mL (ref 0.450–4.500)
Total Protein: 7 g/dL (ref 6.0–8.5)
Uric Acid: 5.1 mg/dL (ref 2.5–7.1)
VLDL Cholesterol Cal: 14 mg/dL (ref 5–40)
WBC: 5.2 10*3/uL (ref 3.4–10.8)

## 2015-09-30 LAB — HEPATITIS C ANTIBODY (REFLEX)

## 2015-09-30 LAB — MICROALBUMIN / CREATININE URINE RATIO
CREATININE, UR: 167.4 mg/dL
MICROALB/CREAT RATIO: 2747.6 mg/g{creat} — AB (ref 0.0–30.0)
MICROALBUM., U, RANDOM: 4599.4 ug/mL

## 2015-09-30 LAB — VITAMIN B12: Vitamin B-12: 418 pg/mL (ref 211–946)

## 2015-09-30 LAB — VITAMIN D 25 HYDROXY (VIT D DEFICIENCY, FRACTURES): Vit D, 25-Hydroxy: 34.5 ng/mL (ref 30.0–100.0)

## 2015-09-30 LAB — HCV COMMENT:

## 2015-09-30 LAB — MAGNESIUM: Magnesium: 2.2 mg/dL (ref 1.6–2.3)

## 2015-10-06 ENCOUNTER — Other Ambulatory Visit: Payer: Self-pay | Admitting: Emergency Medicine

## 2015-10-06 ENCOUNTER — Other Ambulatory Visit: Payer: Self-pay

## 2015-10-06 DIAGNOSIS — H6982 Other specified disorders of Eustachian tube, left ear: Secondary | ICD-10-CM

## 2015-10-06 DIAGNOSIS — Z299 Encounter for prophylactic measures, unspecified: Secondary | ICD-10-CM

## 2015-10-06 MED ORDER — MONTELUKAST SODIUM 10 MG PO TABS: 10.0000 mg | ORAL_TABLET | Freq: Every day | ORAL | Status: DC

## 2015-10-06 MED ORDER — FLUTICASONE PROPIONATE 50 MCG/ACT NA SUSP: 2.0000 | Freq: Every day | NASAL | Status: DC

## 2015-10-06 NOTE — Progress Notes (Signed)
Patient came in to submit a urine to repeat her urine microalbumin/creatinie test per Susan's request.

## 2015-10-06 NOTE — Progress Notes (Signed)
I spoke with the patient about her lab results and she expressed understanding.  Patient will return to the clinic to recheck her urine microalbumin/creatinine levels.

## 2015-10-07 LAB — MICROALBUMIN / CREATININE URINE RATIO: CREATININE, UR: 32.3 mg/dL

## 2016-06-11 ENCOUNTER — Telehealth: Payer: Self-pay

## 2016-06-11 ENCOUNTER — Ambulatory Visit (INDEPENDENT_AMBULATORY_CARE_PROVIDER_SITE_OTHER): Payer: BLUE CROSS/BLUE SHIELD | Admitting: Family Medicine

## 2016-06-11 ENCOUNTER — Encounter: Payer: Self-pay | Admitting: Family Medicine

## 2016-06-11 ENCOUNTER — Ambulatory Visit
Admission: RE | Admit: 2016-06-11 | Discharge: 2016-06-11 | Disposition: A | Discharge status: Home / Self Care | Payer: BLUE CROSS/BLUE SHIELD | Source: Ambulatory Visit | Attending: Family Medicine | Admitting: Family Medicine

## 2016-06-11 VITALS — BP 122/76 | HR 60 | Temp 97.8°F | Resp 16 | Wt 170.8 lb

## 2016-06-11 DIAGNOSIS — R1012 Left upper quadrant pain: Secondary | ICD-10-CM | POA: Insufficient documentation

## 2016-06-11 DIAGNOSIS — K5904 Chronic idiopathic constipation: Secondary | ICD-10-CM

## 2016-06-11 DIAGNOSIS — R319 Hematuria, unspecified: Secondary | ICD-10-CM | POA: Diagnosis not present

## 2016-06-11 DIAGNOSIS — N390 Urinary tract infection, site not specified: Secondary | ICD-10-CM | POA: Diagnosis not present

## 2016-06-11 DIAGNOSIS — R1010 Upper abdominal pain, unspecified: Secondary | ICD-10-CM | POA: Diagnosis present

## 2016-06-11 DIAGNOSIS — Z87442 Personal history of urinary calculi: Secondary | ICD-10-CM | POA: Diagnosis not present

## 2016-06-11 LAB — POCT URINALYSIS DIPSTICK
BILIRUBIN UA: NEGATIVE
Glucose, UA: NEGATIVE
KETONES UA: NEGATIVE
Nitrite, UA: NEGATIVE
PH UA: 5
Protein, UA: NEGATIVE
SPEC GRAV UA: 1.01
Urobilinogen, UA: 0.2

## 2016-06-11 MED ORDER — NITROFURANTOIN MONOHYD MACRO 100 MG PO CAPS: 100.0000 mg | ORAL_CAPSULE | Freq: Two times a day (BID) | ORAL | 0 refills | Status: DC

## 2016-06-11 MED ORDER — FLUCONAZOLE 150 MG PO TABS
150.0000 mg | ORAL_TABLET | Freq: Once | ORAL | 1 refills | Status: AC
Start: 2016-06-11 — End: 2016-06-11

## 2016-06-11 NOTE — Telephone Encounter (Signed)
Patient advised as below. Patient verbalizes understanding and is in agreement with treatment plan.  

## 2016-06-11 NOTE — Patient Instructions (Addendum)
We will call you with the x-ray and culture results. Try Miralax for constipation.

## 2016-06-11 NOTE — Telephone Encounter (Signed)
-----   Message from [place], [place] sent at 06/11/2016 11:36 AM EST ----- Moderate amount of stool but no abnormality or stone seen. Try the Miralax.. We will call you with the results of the urine culture.

## 2016-06-11 NOTE — Progress Notes (Signed)
Subjective:     Patient ID: Joanne Wade, female   DOB: 08-15-1965, 50 y.o.   MRN: [id number]  HPI  Chief Complaint  Patient presents with  . Abdominal Pain    Patient comes in office todya with complaints of LUQ pain for the past month intermittent, patient reports in the past week pain has been more severe. Patient describes pain as sharp and sometimes dull ache. Associated patient complains of constipation, weight gain and feeling bloated. Patient reports taking otc stool softner for relief.   States she was returning from a trip on 06/04/16 and the pain intensified to 7/10 for 2-2.5 hours. Described as sharp and stabbing localized over her left upper abdomen/low chest wall. Denies dysuria and shortness of breath. Reports chronic constipation  (bowels move every 3-4 days) and hx of passing kidney stones in 2015.   Review of Systems     Objective:   Physical Exam  Constitutional: She appears well-developed and well-nourished. No distress.  Pulmonary/Chest: Breath sounds normal. She exhibits no tenderness.  Abdominal: Soft. There is no tenderness.       Assessment:    1. Left upper quadrant pain - POCT urinalysis dipstick - DG Abd 2 Views; Future - Urine culture  2. Chronic idiopathic constipation - DG Abd 2 Views; Future  3. History of kidney stones - POCT urinalysis dipstick  4. Urinary tract infection with hematuria, site unspecified - nitrofurantoin, macrocrystal-monohydrate, (MACROBID) 100 MG capsule; Take 1 capsule (100 mg total) by mouth 2 (two) times daily.  Dispense: 14 capsule; Refill: 0 - fluconazole (DIFLUCAN) 150 MG tablet; Take 1 tablet (150 mg total) by mouth once.  Dispense: 1 tablet; Refill: 1    Plan:    Further f/u pending x-ray and urine culture results. Discussed use of Miralax.

## 2016-06-13 ENCOUNTER — Telehealth: Payer: Self-pay

## 2016-06-13 LAB — URINE CULTURE

## 2016-06-13 NOTE — Telephone Encounter (Signed)
Patient advised as below. Patient verbalizes understanding and is in agreement with treatment plan.  

## 2016-06-13 NOTE — Telephone Encounter (Signed)
-----   Message from [place], [place] sent at 06/13/2016  1:47 PM EST ----- A less than the usual infectious amount of streptococcus found in your urine. Would continue antibiotic for 3 days.. Have you gotten your stools moving better?

## 2016-07-11 ENCOUNTER — Encounter: Payer: PRIVATE HEALTH INSURANCE | Admitting: Obstetrics and Gynecology

## 2016-07-25 ENCOUNTER — Encounter: Payer: Self-pay | Admitting: Obstetrics and Gynecology

## 2016-07-25 ENCOUNTER — Ambulatory Visit (INDEPENDENT_AMBULATORY_CARE_PROVIDER_SITE_OTHER): Payer: BLUE CROSS/BLUE SHIELD | Admitting: Obstetrics and Gynecology

## 2016-07-25 ENCOUNTER — Other Ambulatory Visit: Payer: Self-pay | Admitting: Obstetrics and Gynecology

## 2016-07-25 VITALS — BP 123/84 | HR 71 | Ht 66.0 in | Wt 174.6 lb

## 2016-07-25 DIAGNOSIS — Z01419 Encounter for gynecological examination (general) (routine) without abnormal findings: Secondary | ICD-10-CM | POA: Diagnosis not present

## 2016-07-25 DIAGNOSIS — N3281 Overactive bladder: Secondary | ICD-10-CM | POA: Diagnosis not present

## 2016-07-25 DIAGNOSIS — Z23 Encounter for immunization: Secondary | ICD-10-CM

## 2016-07-25 DIAGNOSIS — E663 Overweight: Secondary | ICD-10-CM | POA: Diagnosis not present

## 2016-07-25 MED ORDER — OXYBUTYNIN CHLORIDE ER 5 MG PO TB24: 5.0000 mg | ORAL_TABLET | Freq: Every day | ORAL | 3 refills | Status: DC

## 2016-07-25 NOTE — Progress Notes (Signed)
Subjective:   Joanne Wade is a 51 y.o. G72P2 Caucasian female here for a routine well-woman exam.  No LMP recorded. Patient has had a hysterectomy.    Current complaints: frequent urination, but drinks a lot of water, voiding at least 7 times a day and 2 times at night, had bladder stretched in past (1996).  PCP: [facility]       doesn't desire labs  Social History: Sexual: heterosexual Marital Status: divorced Living situation: with family Occupation: unemployed Tobacco/alcohol: no tobacco use Illicit drugs: no history of illicit drug use  The following portions of the patient's history were reviewed and updated as appropriate: allergies, current medications, past family history, past medical history, past social history, past surgical history and problem list.  Past Medical History Past Medical History:  Diagnosis Date  . Allergy   . Asthma   . Hyperlipidemia   . Migraine   . 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 ABDOMINAL HYSTERECTOMY     still has cervix  . APPENDECTOMY    . EYE SURGERY    . ROTATOR CUFF REPAIR    . TONSILLECTOMY AND ADENOIDECTOMY  1972    Gynecologic History G2P2  No LMP recorded. Patient has had a hysterectomy. Contraception: status post hysterectomy Last Pap: 2015. Results were: normal Last mammogram: 2017. Results were: normal  Obstetric History OB History  Gravida Para Term Preterm AB Living  2 2          SAB TAB Ectopic Multiple Live Births               # Outcome Date GA Lbr Len/2nd Weight Sex Delivery Anes PTL Lv  2 Para           1 Para               Current Medications Current Outpatient Prescriptions on File Prior to Visit  Medication Sig Dispense Refill  . aspirin 81 MG tablet Take by mouth daily.    . fluticasone (FLONASE) 50 MCG/ACT nasal spray Place 2 sprays into both nostrils daily. 16 g 11  . montelukast (SINGULAIR) 10 MG tablet Take 1 tablet (10 mg  total) by mouth at bedtime. 90 tablet 11  . nitrofurantoin, macrocrystal-monohydrate, (MACROBID) 100 MG capsule Take 1 capsule (100 mg total) by mouth 2 (two) times daily. (Patient not taking: Reported on 07/25/2016) 14 capsule 0   No current facility-administered medications on file prior to visit.     Review of Systems Patient denies any headaches, blurred vision, shortness of breath, chest pain, abdominal pain, problems with bowel movements, urination, or intercourse.  Objective:  BP 123/84   Pulse 71   Ht 5\' 6"  (1.676 m)   Wt 174 lb 9.6 oz (79.2 kg)   BMI 28.18 kg/m  Physical Exam  General:  Well developed, well nourished, no acute distress. She is alert and oriented x3. Skin:  Warm and dry Neck:  Midline trachea, no thyromegaly or nodules Cardiovascular: Regular rate and rhythm, no murmur heard Lungs:  Effort normal, all lung fields clear to auscultation bilaterally Breasts:  No dominant palpable mass, retraction, or nipple discharge Abdomen:  Soft, non tender, no hepatosplenomegaly or masses Pelvic:  External genitalia is normal in appearance.  The vagina is normal in appearance. The cervix is bulbous, no CMT.  Thin prep pap is done with HR HPV cotesting. Uterus is surgically absent.  No adnexal masses or tenderness  noted. Extremities:  No swelling or varicosities noted Psych:  She has a normal mood and affect  Assessment:   Healthy well-woman exam OAB S/P total hysterectomy Overweight Migraines  Needs flu vaccine  Plan:  oxybutin order and will start in next week Will return tomorrow for start of weight loss program Flu vaccine given F/U 1 year for AE, or sooner if needed Mammogram ordered  Darcey Demma Rockney Ghee, CNM

## 2016-07-25 NOTE — Addendum Note (Signed)
Addended by: Keturah Barre L on: 07/25/2016 04:25 PM   Modules accepted: Orders

## 2016-07-29 LAB — CYTOLOGY - PAP

## 2016-08-23 ENCOUNTER — Ambulatory Visit (INDEPENDENT_AMBULATORY_CARE_PROVIDER_SITE_OTHER): Payer: BLUE CROSS/BLUE SHIELD | Admitting: Obstetrics and Gynecology

## 2016-08-23 VITALS — BP 133/86 | HR 61 | Ht 66.0 in | Wt 170.0 lb

## 2016-08-23 DIAGNOSIS — E663 Overweight: Secondary | ICD-10-CM | POA: Diagnosis not present

## 2016-08-23 MED ORDER — OXYBUTYNIN CHLORIDE ER 5 MG PO TB24: 5.0000 mg | ORAL_TABLET | Freq: Two times a day (BID) | ORAL | 3 refills | Status: DC

## 2016-08-23 MED ORDER — CYANOCOBALAMIN 1000 MCG/ML IJ SOLN
1000.0000 ug | Freq: Once | INTRAMUSCULAR | Status: AC
  Administered 2016-08-23: 1000 ug via INTRAMUSCULAR

## 2016-08-23 NOTE — Progress Notes (Signed)
Patient ID: Joanne Wade, female   DOB: [birth year], 51 y.o.   MRN: [id number] Pt presents for weight, B/P, B-12 injection. Side effects of medication-Phentermine, pt has elevated B/P readings at home (B/P-148/89; B/P-138/95) and headaches. After taking phentermine for a few days is when pt noticed symptom. She has since taken 1/2 tab daily and taken magnesium per a pharmacist recommendation for elevated B/P. Pt wanted to know if she could take the other 1/2 tab of phentermine later in the day. Pt does not c/o any other symptoms than headache. Encouraged to continue monitoring B/P at home and contact office if  needed and look for severe headache, vision impairment, dizzyness, and/or chest pain. Either go to ER or contact office. MNB advised pt to take the other 1/2 tab, no later than 1pm and MNB also notified of elevated B/P.  No side effect of B-12.  Weight loss of 4 lbs. Encouraged eating healthy and exercise. Pt also inquired about oxybutynin ER 5mg . Daily. She wanted to increase to twice daily. It is definitely helping. MNB approves for pt to take oxybutynin ER in am and hs.  Ordered a 90 day supply at pt request.

## 2016-08-27 ENCOUNTER — Other Ambulatory Visit: Payer: Self-pay | Admitting: *Deleted

## 2016-08-27 MED ORDER — OXYBUTYNIN CHLORIDE ER 5 MG PO TB24: 5.0000 mg | ORAL_TABLET | Freq: Every day | ORAL | 3 refills | Status: DC

## 2016-09-16 ENCOUNTER — Ambulatory Visit (INDEPENDENT_AMBULATORY_CARE_PROVIDER_SITE_OTHER): Payer: BLUE CROSS/BLUE SHIELD | Admitting: Obstetrics and Gynecology

## 2016-09-16 VITALS — BP 129/84 | HR 63 | Wt 168.4 lb

## 2016-09-16 DIAGNOSIS — E663 Overweight: Secondary | ICD-10-CM | POA: Diagnosis not present

## 2016-09-16 MED ORDER — CYANOCOBALAMIN 1000 MCG/ML IJ SOLN
1000.0000 ug | Freq: Once | INTRAMUSCULAR | Status: AC
  Administered 2016-09-16: 1000 ug via INTRAMUSCULAR

## 2016-09-16 NOTE — Progress Notes (Signed)
Pt is here for wt, bp check, b-12 inj She is doing well,has been sick in the last few weeks, she is only taking 1/2 tablet of the phentermine due to makes her BP elevated

## 2016-10-14 ENCOUNTER — Ambulatory Visit (INDEPENDENT_AMBULATORY_CARE_PROVIDER_SITE_OTHER): Payer: BLUE CROSS/BLUE SHIELD | Admitting: Obstetrics and Gynecology

## 2016-10-14 ENCOUNTER — Encounter: Payer: Self-pay | Admitting: Obstetrics and Gynecology

## 2016-10-14 VITALS — BP 128/87 | Ht 66.0 in | Wt 166.0 lb

## 2016-10-14 VITALS — BP 128/87 | HR 62 | Ht 66.0 in | Wt 166.9 lb

## 2016-10-14 DIAGNOSIS — N3281 Overactive bladder: Secondary | ICD-10-CM | POA: Diagnosis not present

## 2016-10-14 DIAGNOSIS — E663 Overweight: Secondary | ICD-10-CM | POA: Diagnosis not present

## 2016-10-14 DIAGNOSIS — Z79899 Other long term (current) drug therapy: Secondary | ICD-10-CM

## 2016-10-14 MED ORDER — PHENDIMETRAZINE TARTRATE 35 MG PO TABS: 1.0000 | ORAL_TABLET | Freq: Every day | ORAL | 2 refills | Status: DC

## 2016-10-14 MED ORDER — OXYBUTYNIN CHLORIDE ER 10 MG PO TB24: 10.0000 mg | ORAL_TABLET | Freq: Every day | ORAL | 2 refills | Status: AC

## 2016-10-14 MED ORDER — CYANOCOBALAMIN 1000 MCG/ML IJ SOLN: 1000.0000 ug | INTRAMUSCULAR | 1 refills | Status: DC

## 2016-10-14 MED ORDER — CYANOCOBALAMIN 1000 MCG/ML IJ SOLN
1000.0000 ug | Freq: Once | INTRAMUSCULAR | Status: AC
  Administered 2016-10-14: 1000 ug via INTRAMUSCULAR

## 2016-10-14 NOTE — Progress Notes (Signed)
Subjective:     Patient ID: Joanne Wade, female   DOB: [birth year], 51 y.o.   MRN: [id number]  HPI Has been taking weight loss medication as tolerated, as when she takes a whole tablet she feels 'bad' and blood pressure shoots up. Cut down to half a tablet and doesn't feel like it helps at all.    Has lost 9# to date and is doing yoga every morning.   Also feels like she needs to increase her detrol dose, notes a 50% improvement but wants to try a higher dose. Does not get up at all at night, but still voiding 2-4 times in the morning and 3-5 times in evening.   Review of Systems Negative except stated above in HPI    Objective:   Physical Exam A&O x4 Well groomed female in no distress Blood pressure 128/87, height 5\' 6"  (1.676 m), weight 166 lb (75.3 kg). PE not indicated    Assessment:     Overweight Medication management OAB    Plan:     Changed appetite suppressant to Bontril 35 mg daily, will return in 4 weeks for recheck. Increased detral to 10mg  nightly.  >50% of 10 minute visit spent in counseling.  Joanne Wade, CM<

## 2016-10-14 NOTE — Progress Notes (Signed)
Patient ID: Joanne Wade, female   DOB: April 08, 1966, 51 y.o.   MRN: [id number]  Pt present for f/u weight check. Has not been taking phentermine because it makes her sick, B/P elevated. Did want to get B12 injection and this was given. Pt scheduled an appt for f/u with [facility] for this afternoon. She wants to know what other options she has bedsides phentermine. Pt did loose 2 lbs since last visit. Doing yoga.

## 2016-11-11 ENCOUNTER — Ambulatory Visit (INDEPENDENT_AMBULATORY_CARE_PROVIDER_SITE_OTHER): Payer: BLUE CROSS/BLUE SHIELD | Admitting: Obstetrics and Gynecology

## 2016-11-11 VITALS — BP 135/82 | HR 64 | Ht 66.0 in | Wt 166.2 lb

## 2016-11-11 DIAGNOSIS — E663 Overweight: Secondary | ICD-10-CM | POA: Diagnosis not present

## 2016-11-11 MED ORDER — CYANOCOBALAMIN 1000 MCG/ML IJ SOLN
1000.0000 ug | Freq: Once | INTRAMUSCULAR | Status: AC
  Administered 2016-11-11: 1000 ug via INTRAMUSCULAR

## 2016-11-11 NOTE — Progress Notes (Signed)
Pt presents for  Weight,B/P, B-12 injection. No side effects of medications-Phentermine or B-12. Weight /gain _.3___ lbs. Encourage eating healthy and exercise.Is walking for exercise.Return in 4 weeks for NV. Is doing well.

## 2016-12-09 ENCOUNTER — Ambulatory Visit: Payer: BLUE CROSS/BLUE SHIELD

## 2016-12-10 ENCOUNTER — Ambulatory Visit (INDEPENDENT_AMBULATORY_CARE_PROVIDER_SITE_OTHER): Payer: BLUE CROSS/BLUE SHIELD | Admitting: Obstetrics and Gynecology

## 2016-12-10 VITALS — BP 131/84 | HR 77 | Wt 165.6 lb

## 2016-12-10 DIAGNOSIS — E663 Overweight: Secondary | ICD-10-CM | POA: Diagnosis not present

## 2016-12-10 MED ORDER — CYANOCOBALAMIN 1000 MCG/ML IJ SOLN
1000.0000 ug | Freq: Once | INTRAMUSCULAR | Status: AC
  Administered 2016-12-10: 1000 ug via INTRAMUSCULAR

## 2016-12-10 NOTE — Progress Notes (Signed)
Pt is here for wt, bp check, b-12 inj, per pt she has been at the beach and hasnt done as well as she should have   12/10/16 wt- 165.6lb 11/11/16 wt- 166lb 10/14/16 wt- 166lb

## 2017-01-15 ENCOUNTER — Encounter: Payer: BLUE CROSS/BLUE SHIELD | Admitting: Obstetrics and Gynecology

## 2017-01-29 ENCOUNTER — Encounter: Payer: BLUE CROSS/BLUE SHIELD | Admitting: Obstetrics and Gynecology

## 2017-02-12 ENCOUNTER — Encounter: Payer: BLUE CROSS/BLUE SHIELD | Admitting: Obstetrics and Gynecology

## 2017-05-20 ENCOUNTER — Encounter: Payer: Self-pay | Admitting: Family Medicine

## 2017-05-20 ENCOUNTER — Ambulatory Visit (INDEPENDENT_AMBULATORY_CARE_PROVIDER_SITE_OTHER): Payer: BLUE CROSS/BLUE SHIELD | Admitting: Family Medicine

## 2017-05-20 VITALS — BP 136/80 | HR 68 | Temp 98.4°F | Resp 16 | Wt 175.0 lb

## 2017-05-20 DIAGNOSIS — G4700 Insomnia, unspecified: Secondary | ICD-10-CM | POA: Diagnosis not present

## 2017-05-20 DIAGNOSIS — E785 Hyperlipidemia, unspecified: Secondary | ICD-10-CM

## 2017-05-20 DIAGNOSIS — E663 Overweight: Secondary | ICD-10-CM | POA: Insufficient documentation

## 2017-05-20 DIAGNOSIS — R5382 Chronic fatigue, unspecified: Secondary | ICD-10-CM

## 2017-05-20 DIAGNOSIS — G2581 Restless legs syndrome: Secondary | ICD-10-CM

## 2017-05-20 DIAGNOSIS — F419 Anxiety disorder, unspecified: Secondary | ICD-10-CM | POA: Diagnosis not present

## 2017-05-20 NOTE — Patient Instructions (Signed)

## 2017-05-20 NOTE — Progress Notes (Signed)
Patient: Joanne Wade Female    DOB: 07/06/1965   51 y.o.   MRN: [id number] Visit Date: 05/21/2017  Today's Provider: Lavon Paganini, MD   Chief Complaint  Patient presents with  . Fatigue   Subjective:    HPI   Joanne Wade is c/o fatigue/malaise for the last year. She states she snores, which she attributes to weight gain. She tried phentermine earlier in the year but it made her BP elevated.  She is requesting labs.  Has stopped working after getting married recently.  Has difficulty falling asleep.  Was previously working rotating shifts at [employer] that started her insomnia and caused anxiety.  +nocturia at least 2 times per night, requent nocturnal awakenings, intermittent non-restorative sleep. Denies apnea.  Has never taken medication for sleep.  Tries melatonin ~once every 4 months.  Starting Nutrisystem this week.  She is also c/o leg pain that occurs at night. She describes the pain as "burning, cramping, achiness". Feels like she always needs to move her legs.  She is concerned because she also notes palpitations a night only when laying down, intermittently. States that her mind starts racing and she doesn't know how to relax.  She is concerned about RLS vs "blood clots" (as she is flying often).  This has been occurring intermittently for several months, worse in last 6 weeks.  Depression screen [facility] 2/9 05/20/2017 03/13/2015  Decreased Interest 0 0  Down, Depressed, Hopeless 0 0  PHQ - 2 Score 0 0  Altered sleeping 1 -  Tired, decreased energy 2 -  Change in appetite 1 -  Feeling bad or failure about yourself  0 -  Trouble concentrating 1 -  Moving slowly or fidgety/restless 0 -  Suicidal thoughts 0 -  PHQ-9 Score 5 -  Difficult doing work/chores Somewhat difficult -   GAD 7 : Generalized Anxiety Score 05/20/2017  Nervous, Anxious, on Edge 0  Control/stop worrying 1  Worry too much - different things 1  Trouble relaxing 1  Restless 0  Easily  annoyed or irritable 0  Afraid - awful might happen 0  Total GAD 7 Score 3  Anxiety Difficulty Not difficult at all    Epworth Sleepiness Scale:  Sitting and reading: Would never doze Watching TV: Moderate chance of dozing Sitting, inactive in a public place (e.g. a theatre or a meeting): Would never doze As a passenger in a car for an hour without a break: Would never doze Lying down to rest in the afternoon when circumstances permit: High chance of dozing Sitting and talking to someone: Would never doze Sitting quietly after a lunch without alcohol: Would never doze In a car, while stopped for a few minutes in traffic: Would never doze  Total score: 5     Allergies  Allergen Reactions  . Morphine Other (See Comments)    Other Reaction: Other reaction  . Belladonna Alkaloids   . Lexapro [Escitalopram Oxalate]   . Morphine And Related Itching     Current Outpatient Medications:  .  aspirin 81 MG tablet, Take by mouth daily., Disp: , Rfl:  .  fluticasone (FLONASE) 50 MCG/ACT nasal spray, Place 2 sprays into both nostrils daily., Disp: 16 g, Rfl: 11 .  montelukast (SINGULAIR) 10 MG tablet, Take 1 tablet (10 mg total) by mouth at bedtime., Disp: 90 tablet, Rfl: 11  Review of Systems  Constitutional: Positive for activity change, fatigue and unexpected weight change (gain). Negative for appetite  change, chills, diaphoresis and fever.  Respiratory: Negative for shortness of breath.   Cardiovascular: Positive for palpitations. Negative for chest pain and leg swelling.  Musculoskeletal: Positive for myalgias.  Psychiatric/Behavioral: Positive for decreased concentration and sleep disturbance. Negative for agitation, confusion, dysphoric mood and suicidal ideas. The patient is nervous/anxious.     Social History   Tobacco Use  . Smoking status: Never Smoker  . Smokeless tobacco: Never Used  Substance Use Topics  . Alcohol use: Yes    Comment: ocassional   Objective:    BP 136/80 (BP Location: Left Arm, Patient Position: Sitting, Cuff Size: Large)   Pulse 68   Temp 98.4 F (36.9 C) (Oral)   Resp 16   Wt 175 lb (79.4 kg)   SpO2 97%   BMI 28.25 kg/m  Vitals:   05/20/17 0813  BP: 136/80  Pulse: 68  Resp: 16  Temp: 98.4 F (36.9 C)  TempSrc: Oral  SpO2: 97%  Weight: 175 lb (79.4 kg)     Physical Exam  Constitutional: She is oriented to person, place, and time. She appears well-developed and well-nourished. No distress.  HENT:  Head: Normocephalic and atraumatic.  Eyes: Conjunctivae are normal. Pupils are equal, round, and reactive to light. No scleral icterus.  Neck: Neck supple. No thyromegaly present.  Cardiovascular: Normal rate, regular rhythm, normal heart sounds and intact distal pulses.  No murmur heard. Pulmonary/Chest: Breath sounds normal. No respiratory distress. She has no wheezes. She has no rales.  Musculoskeletal: She exhibits no edema or deformity.  No calf tenderness, negative Homans sign  Lymphadenopathy:    She has no cervical adenopathy.  Neurological: She is alert and oriented to person, place, and time.  Skin: Skin is warm and dry. No rash noted.  Psychiatric: She has a normal mood and affect. Her behavior is normal.  Vitals reviewed.       Assessment & Plan:      Problem List Items Addressed This Visit      Other   Hyperlipidemia    Not currently taking a statin Recheck lipid panel      Relevant Orders   Lipid panel (Completed)   Anxiety    Likely contributing to insomnia and fatigue Patient describes racing thoughts that prevent her from falling asleep Suspect this is the cause of palpitations as well Discussed possibility of therapy and SSRIs - patient declines States she is "not a medicine person."      Overweight    Discussed healthy weight management, diet, and exercise Check labs as below      Relevant Orders   Comprehensive metabolic panel   CBC (Completed)   TSH (Completed)   Restless  leg syndrome    Patient describes restless leg syndrome Reassured her that there are no signs or symptoms of DVT Discussed DVT prevention when flying - taking shorter flights, walking around, wearing compression socks Check labs Consider medication at follow-up visit, but patient states she is "not a medicine person."      Relevant Orders   Comprehensive metabolic panel   CBC (Completed)   TSH (Completed)   VITAMIN D 25 Hydroxy (Vit-D Deficiency, Fractures) (Completed)   B12 (Completed)   Magnesium (Completed)   Chronic fatigue - Primary    Likely multifactorial with anxiety and insomnia contributing (despite low GAD7 score) No signs of sleep apnea and Epworth not significant Check labs Discussed sleep hygiene (patient currently sleeping with TV on) Discussed considering CBTi Advised writing down to-do list and  worrisome thoughts to let them go      Relevant Orders   Comprehensive metabolic panel   CBC (Completed)   TSH (Completed)   VITAMIN D 25 Hydroxy (Vit-D Deficiency, Fractures) (Completed)   B12 (Completed)   Magnesium (Completed)   Insomnia    Likely multifactorial - poor sleep hygiene, previously a shift worker, anxiety, etc Discussed sleep hygiene Discussed melatonin Discussed CBTi Check labs as above         Return in about 4 weeks (around 06/17/2017).      The entirety of the information documented in the History of Present Illness, Review of Systems and Physical Exam were personally obtained by me. Portions of this information were initially documented by Raquel Sarna Ratchford, CMA and reviewed by me for thoroughness and accuracy.     Lavon Paganini, MD  [facility]

## 2017-05-21 DIAGNOSIS — R5382 Chronic fatigue, unspecified: Secondary | ICD-10-CM | POA: Insufficient documentation

## 2017-05-21 DIAGNOSIS — G4700 Insomnia, unspecified: Secondary | ICD-10-CM | POA: Insufficient documentation

## 2017-05-21 LAB — LIPID PANEL
CHOL/HDL RATIO: 4.2 (calc) (ref <5.0)
CHOLESTEROL: 223 mg/dL — AB (ref <200)
HDL: 53 mg/dL (ref >50)
LDL Cholesterol (Calc): 146 mg/dL (calc) — ABNORMAL HIGH
NON-HDL CHOLESTEROL (CALC): 170 mg/dL — AB (ref <130)
TRIGLYCERIDES: 119 mg/dL (ref <150)

## 2017-05-21 LAB — CBC
HCT: 40.1 % (ref 35.0–45.0)
Hemoglobin: 13.1 g/dL (ref 11.7–15.5)
MCH: 27.9 pg (ref 27.0–33.0)
MCHC: 32.7 g/dL (ref 32.0–36.0)
MCV: 85.3 fL (ref 80.0–100.0)
MPV: 10.1 fL (ref 7.5–12.5)
PLATELETS: 263 10*3/uL (ref 140–400)
RBC: 4.7 10*6/uL (ref 3.80–5.10)
RDW: 12.1 % (ref 11.0–15.0)
WBC: 5.6 10*3/uL (ref 3.8–10.8)

## 2017-05-21 LAB — MAGNESIUM: Magnesium: 2 mg/dL (ref 1.5–2.5)

## 2017-05-21 LAB — COMPLETE METABOLIC PANEL WITH GFR
AG RATIO: 1.7 (calc) (ref 1.0–2.5)
ALT: 22 U/L (ref 6–29)
AST: 19 U/L (ref 10–35)
Albumin: 4.3 g/dL (ref 3.6–5.1)
Alkaline phosphatase (APISO): 67 U/L (ref 33–130)
BILIRUBIN TOTAL: 0.4 mg/dL (ref 0.2–1.2)
BUN: 18 mg/dL (ref 7–25)
CALCIUM: 9.1 mg/dL (ref 8.6–10.4)
CHLORIDE: 107 mmol/L (ref 98–110)
CO2: 28 mmol/L (ref 20–32)
Creat: 0.94 mg/dL (ref 0.50–1.05)
GFR, Est African American: 82 mL/min/{1.73_m2} (ref >=60)
GFR, Est Non African American: 71 mL/min/{1.73_m2} (ref >=60)
Globulin: 2.5 g/dL (calc) (ref 1.9–3.7)
Glucose, Bld: 97 mg/dL (ref 65–99)
POTASSIUM: 4.1 mmol/L (ref 3.5–5.3)
Sodium: 140 mmol/L (ref 135–146)
Total Protein: 6.8 g/dL (ref 6.1–8.1)

## 2017-05-21 LAB — TSH: TSH: 1.73 m[IU]/L

## 2017-05-21 LAB — VITAMIN D 25 HYDROXY (VIT D DEFICIENCY, FRACTURES): VIT D 25 HYDROXY: 38 ng/mL (ref 30–100)

## 2017-05-21 LAB — VITAMIN B12: Vitamin B-12: 692 pg/mL (ref 200–1100)

## 2017-05-21 NOTE — Assessment & Plan Note (Signed)
Discussed healthy weight management, diet, and exercise Check labs as below

## 2017-05-21 NOTE — Assessment & Plan Note (Signed)
Patient describes restless leg syndrome Reassured her that there are no signs or symptoms of DVT Discussed DVT prevention when flying - taking shorter flights, walking around, wearing compression socks Check labs Consider medication at follow-up visit, but patient states she is "not a medicine person."

## 2017-05-21 NOTE — Assessment & Plan Note (Signed)
Not currently taking a statin Recheck lipid panel

## 2017-05-21 NOTE — Assessment & Plan Note (Signed)
Likely contributing to insomnia and fatigue Patient describes racing thoughts that prevent her from falling asleep Suspect this is the cause of palpitations as well Discussed possibility of therapy and SSRIs - patient declines States she is "not a medicine person."

## 2017-05-21 NOTE — Assessment & Plan Note (Signed)
Likely multifactorial - poor sleep hygiene, previously a shift worker, anxiety, etc Discussed sleep hygiene Discussed melatonin Discussed CBTi Check labs as above

## 2017-05-21 NOTE — Assessment & Plan Note (Signed)
Likely multifactorial with anxiety and insomnia contributing (despite low GAD7 score) No signs of sleep apnea and Epworth not significant Check labs Discussed sleep hygiene (patient currently sleeping with TV on) Discussed considering CBTi Advised writing down to-do list and worrisome thoughts to let them go

## 2017-05-26 ENCOUNTER — Encounter: Payer: Self-pay | Admitting: Family Medicine

## 2017-06-19 ENCOUNTER — Ambulatory Visit: Payer: Self-pay | Admitting: Family Medicine

## 2017-07-29 ENCOUNTER — Encounter: Payer: BLUE CROSS/BLUE SHIELD | Admitting: Obstetrics and Gynecology

## 2017-08-27 ENCOUNTER — Other Ambulatory Visit: Payer: Self-pay | Admitting: Surgery

## 2017-08-27 DIAGNOSIS — M7582 Other shoulder lesions, left shoulder: Secondary | ICD-10-CM

## 2017-09-02 ENCOUNTER — Ambulatory Visit
Admission: RE | Admit: 2017-09-02 | Discharge: 2017-09-02 | Disposition: A | Discharge status: Home / Self Care | Payer: BLUE CROSS/BLUE SHIELD | Source: Ambulatory Visit | Attending: Surgery | Admitting: Surgery

## 2017-09-02 DIAGNOSIS — M7582 Other shoulder lesions, left shoulder: Secondary | ICD-10-CM

## 2017-10-15 ENCOUNTER — Encounter: Payer: Self-pay | Admitting: Obstetrics and Gynecology

## 2017-10-15 ENCOUNTER — Ambulatory Visit (INDEPENDENT_AMBULATORY_CARE_PROVIDER_SITE_OTHER): Payer: BLUE CROSS/BLUE SHIELD | Admitting: Obstetrics and Gynecology

## 2017-10-15 VITALS — BP 110/69 | HR 60 | Ht 66.0 in | Wt 165.7 lb

## 2017-10-15 DIAGNOSIS — Z800 Family history of malignant neoplasm of digestive organs: Secondary | ICD-10-CM | POA: Diagnosis not present

## 2017-10-15 DIAGNOSIS — Z01419 Encounter for gynecological examination (general) (routine) without abnormal findings: Secondary | ICD-10-CM | POA: Diagnosis not present

## 2017-10-15 DIAGNOSIS — Z23 Encounter for immunization: Secondary | ICD-10-CM

## 2017-10-15 MED ORDER — TETANUS-DIPHTH-ACELL PERTUSSIS 5-2.5-18.5 LF-MCG/0.5 IM SUSP
0.5000 mL | Freq: Once | INTRAMUSCULAR | Status: AC
  Administered 2017-10-15: 0.5 mL via INTRAMUSCULAR

## 2017-10-15 NOTE — Progress Notes (Signed)
Subjective:   Joanne Wade is a 52 y.o. G55P2 Caucasian female here for a routine well-woman exam.  No LMP recorded. Patient has had a hysterectomy.    Current complaints: none PCP: Chau[facility]       doesn't desire labs  Social History: Sexual: heterosexual Marital Status: married Living situation: with family Occupation: unknown occupation Tobacco/alcohol: no tobacco use Illicit drugs: no history of illicit drug use  The following portions of the patient's history were reviewed and updated as appropriate: allergies, current medications, past family history, past medical history, past social history, past surgical history and problem list.  Past Medical History Past Medical History:  Diagnosis Date  . Allergy   . Asthma   . Hyperlipidemia   . Migraine   . 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 ABDOMINAL HYSTERECTOMY     still has cervix  . APPENDECTOMY    . EYE SURGERY     03/25/2017 and 04/15/2017 "new lenses put in my eyes"  . ROTATOR CUFF REPAIR    . TONSILLECTOMY AND ADENOIDECTOMY  1972    Gynecologic History G2P2  No LMP recorded. Patient has had a hysterectomy. Contraception: status post hysterectomy Last Pap: 2018. Results were: normal Last mammogram: 08/2015. Results were: normal   Obstetric History OB History  Gravida Para Term Preterm AB Living  2 2          SAB TAB Ectopic Multiple Live Births               # Outcome Date GA Lbr Len/2nd Weight Sex Delivery Anes PTL Lv  2 Para           1 Para             Current Medications Current Outpatient Medications on File Prior to Visit  Medication Sig Dispense Refill  . aspirin 81 MG tablet Take by mouth daily.    . fluticasone (FLONASE) 50 MCG/ACT nasal spray Place 2 sprays into both nostrils daily. 16 g 11  . montelukast (SINGULAIR) 10 MG tablet Take 1 tablet (10 mg total) by mouth at bedtime. 90 tablet 11   No current  facility-administered medications on file prior to visit.     Review of Systems Patient denies any headaches, blurred vision, shortness of breath, chest pain, abdominal pain, problems with bowel movements, urination, or intercourse.  Objective:  BP 110/69   Pulse 60   Ht 5\' 6"  (1.676 m)   Wt 165 lb 11.2 oz (75.2 kg)   BMI 26.74 kg/m  Physical Exam  General:  Well developed, well nourished, no acute distress. She is alert and oriented x3. Skin:  Warm and dry Neck:  Midline trachea, no thyromegaly or nodules Cardiovascular: Regular rate and rhythm, no murmur heard Lungs:  Effort normal, all lung fields clear to auscultation bilaterally Breasts:  No dominant palpable mass, retraction, or nipple discharge Abdomen:  Soft, non tender, no hepatosplenomegaly or masses Pelvic:  External genitalia is normal in appearance.  The vagina is normal in appearance. The cervix is bulbous, no CMT.  Thin prep pap is not done. Uterus is felt to be normal size, shape, and contour.  No adnexal masses or tenderness noted. Extremities:  No swelling or varicosities noted Psych:  She has a normal mood and affect  Assessment:   Healthy well-woman exam Needs Tdap vaccine Family history of colon cancer in [place]  Plan:  Tdap given F/U 1  year for AE, or sooner if needed Mammogram ordered Colonoscopy due  Melody Rockney Ghee, CNM

## 2017-10-15 NOTE — Addendum Note (Signed)
Addended by: Keturah Barre L on: 10/15/2017 01:53 PM   Modules accepted: Orders

## 2017-10-16 ENCOUNTER — Telehealth: Payer: Self-pay | Admitting: Gastroenterology

## 2017-10-16 NOTE — Telephone Encounter (Signed)
Gastroenterology Pre-Procedure Review  Request Date:   Requesting Physician: Dr.    PATIENT REVIEW QUESTIONS: The patient responded to the following health history questions as indicated:    1. Are you having any GI issues? no 2. Do you have a personal history of Polyps? no 3. Do you have a family history of Colon Cancer or Polyps? yes (paternal grandfather colon cancer) 1. Diabetes Mellitus? no 5. Joint replacements in the past 12 months?no 6. Major health problems in the past 3 months?yes (shoulder surgery ) 7. Any artificial heart valves, MVP, or defibrillator?no    MEDICATIONS & ALLERGIES:    Patient reports the following regarding taking any anticoagulation/antiplatelet therapy:   Plavix, Coumadin, Eliquis, Xarelto, Lovenox, Pradaxa, Brilinta, or Effient? no Aspirin? yes (81 MG ASA)  Patient confirms/reports the following medications:  Current Outpatient Medications  Medication Sig Dispense Refill   aspirin 81 MG tablet Take by mouth daily.     fluticasone (FLONASE) 50 MCG/ACT nasal spray Place 2 sprays into both nostrils daily. 16 g 11   montelukast (SINGULAIR) 10 MG tablet Take 1 tablet (10 mg total) by mouth at bedtime. 90 tablet 11   No current facility-administered medications for this visit.     Patient confirms/reports the following allergies:  Allergies  Allergen Reactions   Morphine Other (See Comments)    Other Reaction: Other reaction   Belladonna Alkaloids    Lexapro [Escitalopram Oxalate]    Morphine And Related Itching    No orders of the defined types were placed in this encounter.   AUTHORIZATION INFORMATION Primary Insurance: 1D#: Group #:  Secondary Insurance: 1D#: Group #:  SCHEDULE INFORMATION: Date:  Time: Location:

## 2017-10-17 ENCOUNTER — Other Ambulatory Visit: Payer: Self-pay | Admitting: Obstetrics and Gynecology

## 2017-10-17 DIAGNOSIS — Z1231 Encounter for screening mammogram for malignant neoplasm of breast: Secondary | ICD-10-CM

## 2017-10-27 ENCOUNTER — Other Ambulatory Visit: Payer: Self-pay

## 2017-10-27 DIAGNOSIS — Z1211 Encounter for screening for malignant neoplasm of colon: Secondary | ICD-10-CM

## 2017-10-27 DIAGNOSIS — Z800 Family history of malignant neoplasm of digestive organs: Secondary | ICD-10-CM

## 2017-11-04 ENCOUNTER — Encounter: Payer: Self-pay | Admitting: Obstetrics and Gynecology

## 2017-11-05 ENCOUNTER — Other Ambulatory Visit: Payer: Self-pay | Admitting: Obstetrics and Gynecology

## 2017-11-05 ENCOUNTER — Ambulatory Visit
Admission: RE | Admit: 2017-11-05 | Discharge: 2017-11-05 | Disposition: A | Discharge status: Home / Self Care | Payer: BLUE CROSS/BLUE SHIELD | Source: Ambulatory Visit | Attending: Obstetrics and Gynecology | Admitting: Obstetrics and Gynecology

## 2017-11-05 DIAGNOSIS — Z01419 Encounter for gynecological examination (general) (routine) without abnormal findings: Secondary | ICD-10-CM

## 2017-11-05 DIAGNOSIS — Z1231 Encounter for screening mammogram for malignant neoplasm of breast: Secondary | ICD-10-CM | POA: Diagnosis present

## 2017-11-07 ENCOUNTER — Telehealth: Payer: Self-pay | Admitting: Gastroenterology

## 2017-11-07 NOTE — Telephone Encounter (Signed)
[place] and approved bowel prep change.

## 2017-11-07 NOTE — Telephone Encounter (Signed)
Joanne Wade from [facility] left vm pt told them she could get Alternative for Suprep  They have a Gaval[facility] G there please call  [facility] to confirm [phone]

## 2017-11-25 ENCOUNTER — Encounter: Payer: Self-pay | Admitting: Certified Registered"

## 2017-11-25 ENCOUNTER — Encounter
Admission: RE | Disposition: A | Discharge status: Home / Self Care | Payer: Self-pay | Source: Ambulatory Visit | Attending: Gastroenterology

## 2017-11-25 ENCOUNTER — Ambulatory Visit
Admission: RE | Admit: 2017-11-25 | Discharge: 2017-11-25 | Disposition: A | Discharge status: Home / Self Care | Payer: BLUE CROSS/BLUE SHIELD | Source: Ambulatory Visit | Attending: Gastroenterology | Admitting: Gastroenterology

## 2017-11-25 ENCOUNTER — Ambulatory Visit: Payer: BLUE CROSS/BLUE SHIELD | Admitting: Certified Registered"

## 2017-11-25 DIAGNOSIS — Z1211 Encounter for screening for malignant neoplasm of colon: Secondary | ICD-10-CM

## 2017-11-25 DIAGNOSIS — Z7982 Long term (current) use of aspirin: Secondary | ICD-10-CM | POA: Diagnosis not present

## 2017-11-25 DIAGNOSIS — K640 First degree hemorrhoids: Secondary | ICD-10-CM | POA: Insufficient documentation

## 2017-11-25 DIAGNOSIS — Z79899 Other long term (current) drug therapy: Secondary | ICD-10-CM | POA: Insufficient documentation

## 2017-11-25 DIAGNOSIS — D125 Benign neoplasm of sigmoid colon: Secondary | ICD-10-CM

## 2017-11-25 DIAGNOSIS — Z800 Family history of malignant neoplasm of digestive organs: Secondary | ICD-10-CM

## 2017-11-25 DIAGNOSIS — K5730 Diverticulosis of large intestine without perforation or abscess without bleeding: Secondary | ICD-10-CM | POA: Insufficient documentation

## 2017-11-25 DIAGNOSIS — E785 Hyperlipidemia, unspecified: Secondary | ICD-10-CM | POA: Diagnosis not present

## 2017-11-25 DIAGNOSIS — K635 Polyp of colon: Secondary | ICD-10-CM | POA: Diagnosis not present

## 2017-11-25 HISTORY — PX: COLONOSCOPY WITH PROPOFOL: SHX5780

## 2017-11-25 SURGERY — COLONOSCOPY WITH PROPOFOL
Anesthesia: General

## 2017-11-25 MED ORDER — SODIUM CHLORIDE 0.9 % IV SOLN
INTRAVENOUS | Status: DC
  Administered 2017-11-25: 1000 mL via INTRAVENOUS

## 2017-11-25 MED ORDER — PROPOFOL 500 MG/50ML IV EMUL
INTRAVENOUS | Status: AC
  Filled 2017-11-25: qty 50

## 2017-11-25 MED ORDER — LIDOCAINE HCL (PF) 1 % IJ SOLN
INTRAMUSCULAR | Status: AC
  Administered 2017-11-25: 0.3 mL via INTRADERMAL
  Filled 2017-11-25: qty 2

## 2017-11-25 MED ORDER — LIDOCAINE HCL (CARDIAC) PF 100 MG/5ML IV SOSY
PREFILLED_SYRINGE | INTRAVENOUS | Status: DC | PRN
  Administered 2017-11-25: 40 mg via INTRAVENOUS

## 2017-11-25 MED ORDER — SODIUM CHLORIDE 0.9 % IV SOLN
INTRAVENOUS | Status: DC | PRN
  Administered 2017-11-25: 09:00:00 via INTRAVENOUS

## 2017-11-25 MED ORDER — GLYCOPYRROLATE 0.2 MG/ML IJ SOLN
INTRAMUSCULAR | Status: AC
  Filled 2017-11-25: qty 1

## 2017-11-25 MED ORDER — PROPOFOL 10 MG/ML IV BOLUS
INTRAVENOUS | Status: DC | PRN
  Administered 2017-11-25 (×2): 50 mg via INTRAVENOUS
  Administered 2017-11-25: 200 mg via INTRAVENOUS
  Administered 2017-11-25 (×2): 50 mg via INTRAVENOUS

## 2017-11-25 MED ORDER — LIDOCAINE HCL (PF) 2 % IJ SOLN
INTRAMUSCULAR | Status: AC
  Filled 2017-11-25: qty 10

## 2017-11-25 MED ORDER — LIDOCAINE HCL (PF) 1 % IJ SOLN
2.0000 mL | Freq: Once | INTRAMUSCULAR | Status: AC
  Administered 2017-11-25: 0.3 mL via INTRADERMAL

## 2017-11-25 MED ORDER — GLYCOPYRROLATE 0.2 MG/ML IJ SOLN
INTRAMUSCULAR | Status: DC | PRN
  Administered 2017-11-25: 0.1 mg via INTRAVENOUS

## 2017-11-25 NOTE — Anesthesia Postprocedure Evaluation (Signed)
Anesthesia Post Note  Patient: Aunika Kirsten Depaz  Procedure(s) Performed: COLONOSCOPY WITH PROPOFOL (N/A )  Patient location during evaluation: Endoscopy Anesthesia Type: General Level of consciousness: awake and alert, oriented and patient cooperative Pain management: satisfactory to patient Vital Signs Assessment: post-procedure vital signs reviewed and stable Respiratory status: spontaneous breathing and respiratory function stable Cardiovascular status: blood pressure returned to baseline and stable Postop Assessment: no headache, no backache, patient able to bend at knees, no apparent nausea or vomiting, adequate PO intake and able to ambulate Anesthetic complications: no     Last Vitals:  Vitals:   11/25/17 0853 11/25/17 0941  BP: (!) 145/85 100/65  Pulse: (!) 53   Resp: 17   Temp: (!) 35.8 C (!) 36.1 C  SpO2: 100% 100%    Last Pain:  Vitals:   11/25/17 0941  TempSrc: Tympanic  PainSc:                  Clovis Riley Zakiyah Diop

## 2017-11-25 NOTE — Anesthesia Preprocedure Evaluation (Signed)
Anesthesia Evaluation  Patient identified by MRN, date of birth, ID band Patient awake    Reviewed: Allergy & Precautions, H&P , NPO status , Patient's Chart, lab work & pertinent test results, reviewed documented beta blocker date and time   History of Anesthesia Complications Negative for: history of anesthetic complications  Airway Mallampati: II  TM Distance: >3 FB Neck ROM: full    Dental no notable dental hx.    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)   Reproductive/Obstetrics negative OB ROS                             Anesthesia Physical Anesthesia Plan  ASA: II  Anesthesia Plan: General   Post-op Pain Management:    Induction: Intravenous  PONV Risk Score and Plan: 3 and Propofol infusion  Airway Management Planned: Nasal Cannula  Additional Equipment:   Intra-op Plan:   Post-operative Plan:   Informed Consent: I have reviewed the patients History and Physical, chart, labs and discussed the procedure including the risks, benefits and alternatives for the proposed anesthesia with the patient or authorized representative who has indicated his/her understanding and acceptance.   Dental Advisory Given  Plan Discussed with: Anesthesiologist, CRNA and Surgeon  Anesthesia Plan Comments:         Anesthesia  Quick Evaluation

## 2017-11-25 NOTE — Transfer of Care (Signed)
Immediate Anesthesia Transfer of Care Note  Patient: Joanne Wade  Procedure(s) Performed: COLONOSCOPY WITH PROPOFOL (N/A )  Patient Location: PACU and Endoscopy Unit  Anesthesia Type:General  Level of Consciousness: awake, alert , oriented and patient cooperative  Airway & Oxygen Therapy: Patient Spontanous Breathing  Post-op Assessment: Report given to RN, Post -op Vital signs reviewed and stable and Patient moving all extremities  Post vital signs: Reviewed and stable  Last Vitals:  Vitals Value Taken Time  BP 100/65 11/25/2017  9:41 AM  Temp 36.1 C 11/25/2017  9:41 AM  Pulse 64 11/25/2017  9:42 AM  Resp 13 11/25/2017  9:42 AM  SpO2 98 % 11/25/2017  9:42 AM  Vitals shown include unvalidated device data.  Last Pain:  Vitals:   11/25/17 0941  TempSrc: Tympanic  PainSc:          Complications: No apparent anesthesia complications

## 2017-11-25 NOTE — Anesthesia Post-op Follow-up Note (Signed)
Anesthesia QCDR form completed.        

## 2017-11-25 NOTE — H&P (Signed)
Joanne Lame, MD [address] [place], [place] [address] Phone: [phone] Fax : [phone]  Primary Care Physician:  Virginia Crews, MD Primary Gastroenterologist:  Dr. Allen Norris  Pre-Procedure History & Physical: HPI:  Joanne Wade is a 52 y.o. female is here for a screening colonoscopy.   Past Medical History:  Diagnosis Date  . Allergy   . Asthma   . Hyperlipidemia   . Migraine   . 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[facility])     Past Surgical History:  Procedure Laterality Date  . ABDOMINAL HYSTERECTOMY     still has cervix  . APPENDECTOMY    . EYE SURGERY     03/25/2017 and 04/15/2017 "new lenses put in my eyes"  . ROTATOR CUFF REPAIR    . TONSILLECTOMY AND ADENOIDECTOMY  1972    Prior to Admission medications   Medication Sig Start Date End Date Taking? Authorizing Provider  aspirin 81 MG tablet Take by mouth daily.   Yes [provider]  fluticasone (FLONASE) 50 MCG/ACT nasal spray Place 2 sprays into both nostrils daily. 10/06/15   Sable Feil, PA-C  montelukast (SINGULAIR) 10 MG tablet Take 1 tablet (10 mg total) by mouth at bedtime. 10/06/15   Sable Feil, PA-C    Allergies as of 10/27/2017 - Review Complete 10/15/2017  Allergen Reaction Noted  . Morphine Other (See Comments) 12/31/2014  . Belladonna alkaloids  12/31/2014  . Lexapro [escitalopram oxalate]  07/25/2016  . Morphine and related Itching 12/12/2014    Family History  Problem Relation Age of Onset  . Alcohol abuse Mother   . Hypertension Mother   . Melanoma Mother   . Cancer Mother        brain and melanoma  . Cancer Father        lung  . Colon cancer Paternal Grandfather   . Breast cancer Neg Hx     Social History   Socioeconomic History  . Marital status: Single    Spouse name: Not on file  . Number of children: 2  . Years of education: Not on file  . Highest education level: Not on file  Occupational History  . Not on file  Social Needs  . Financial  resource strain: Not on file  . Food insecurity:    Worry: Not on file    Inability: Not on file  . Transportation needs:    Medical: Not on file    Non-medical: Not on file  Tobacco Use  . Smoking status: Never Smoker  . Smokeless tobacco: Never Used  Substance and Sexual Activity  . Alcohol use: Yes    Comment: ocassional  . Drug use: No  . Sexual activity: Yes    Birth control/protection: Surgical  Lifestyle  . Physical activity:    Days per week: Not on file    Minutes per session: Not on file  . Stress: Not on file  Relationships  . Social connections:    Talks on phone: Not on file    Gets together: Not on file    Attends religious service: Not on file    Active member of club or organization: Not on file    Attends meetings of clubs or organizations: Not on file    Relationship status: Not on file  . Intimate partner violence:    Fear of current or ex partner: Not on file    Emotionally abused: Not on file    Physically abused: Not on file  Forced sexual activity: Not on file  Other Topics Concern  . Not on file  Social History Narrative  . Not on file    Review of Systems: See HPI, otherwise negative ROS  Physical Exam: BP (!) 145/85   Pulse (!) 53   Temp (!) 96.5 F (35.8 C) (Tympanic)   Resp 17   Ht 5\' 6"  (1.676 m)   Wt 160 lb (72.6 kg)   SpO2 100%   BMI 25.82 kg/m  General:   Alert,  pleasant and cooperative in NAD Head:  Normocephalic and atraumatic. Neck:  Supple; no masses or thyromegaly. Lungs:  Clear throughout to auscultation.    Heart:  Regular rate and rhythm. Abdomen:  Soft, nontender and nondistended. Normal bowel sounds, without guarding, and without rebound.   Neurologic:  Alert and  oriented x4;  grossly normal neurologically.  Impression/Plan: Joanne Wade is now here to undergo a screening colonoscopy.  Risks, benefits, and alternatives regarding colonoscopy have been reviewed with the patient.  Questions have been  answered.  All parties agreeable.

## 2017-11-25 NOTE — Op Note (Signed)
[facility] Gastroenterology Patient Name: Joanne Wade Procedure Date: 11/25/2017 9:12 AM MRN: [id number] Account #: [id number] Date of Birth: 14-May-1966 Admit Type: Outpatient Age: 52 Room: [facility] ENDO ROOM 4 Gender: Female Note Status: Finalized Procedure:            Colonoscopy Indications:          Screening for colorectal malignant neoplasm Providers:            Lucilla Lame MD, MD Referring MD:         Dionne Bucy. Bacigalupo (Referring MD) Medicines:            Propofol per Anesthesia Complications:        No immediate complications. Procedure:            Pre-Anesthesia Assessment:                       - Prior to the procedure, a History and Physical was                        performed, and patient medications and allergies were                        reviewed. The patient's tolerance of previous                        anesthesia was also reviewed. The risks and benefits of                        the procedure and the sedation options and risks were                        discussed with the patient. All questions were                        answered, and informed consent was obtained. Prior                        Anticoagulants: The patient has taken no previous                        anticoagulant or antiplatelet agents. ASA Grade                        Assessment: II - A patient with mild systemic disease.                        After reviewing the risks and benefits, the patient was                        deemed in satisfactory condition to undergo the                        procedure.                       After obtaining informed consent, the colonoscope was                        passed under direct vision. Throughout the procedure,  the patient's blood pressure, pulse, and oxygen                        saturations were monitored continuously. The                        Colonoscope was introduced through the anus and              advanced to the the cecum, identified by appendiceal                        orifice and ileocecal valve. The colonoscopy was                        performed without difficulty. The patient tolerated the                        procedure well. The quality of the bowel preparation                        was excellent. Findings:      The perianal and digital rectal examinations were normal.      Two sessile polyps were found in the sigmoid colon. The polyps were 2 to       3 mm in size. These polyps were removed with a cold biopsy forceps.       Resection and retrieval were complete.      Multiple small-mouthed diverticula were found in the sigmoid colon.      Non-bleeding internal hemorrhoids were found during retroflexion. The       hemorrhoids were Grade I (internal hemorrhoids that do not prolapse). Impression:           - Two 2 to 3 mm polyps in the sigmoid colon, removed                        with a cold biopsy forceps. Resected and retrieved.                       - Diverticulosis in the sigmoid colon.                       - Non-bleeding internal hemorrhoids. Recommendation:       - Repeat colonoscopy in 5 years if polyp adenoma and 10                        years if hyperplastic Procedure Code(s):    --- Professional ---                       [phone], Colonoscopy, flexible; with biopsy, single or                        multiple Diagnosis Code(s):    --- Professional ---                       Z12.11, Encounter for screening for malignant neoplasm                        of colon  D12.5, Benign neoplasm of sigmoid colon CPT copyright 2017 American Medical Association. All rights reserved. The codes documented in this report are preliminary and upon coder review may  be revised to meet current compliance requirements. Lucilla Lame MD, MD 11/25/2017 9:39:05 AM This report has been signed electronically. Number of Addenda: 0 Note Initiated On: 11/25/2017  9:12 AM Scope Withdrawal Time: 0 hours 8 minutes 11 seconds  Total Procedure Duration: 0 hours 13 minutes 17 seconds       [facility]

## 2017-11-26 LAB — SURGICAL PATHOLOGY

## 2017-11-27 ENCOUNTER — Encounter: Payer: Self-pay | Admitting: Gastroenterology

## 2018-03-18 ENCOUNTER — Other Ambulatory Visit: Payer: BLUE CROSS/BLUE SHIELD

## 2018-03-18 ENCOUNTER — Telehealth: Payer: Self-pay | Admitting: Obstetrics and Gynecology

## 2018-03-18 ENCOUNTER — Other Ambulatory Visit: Payer: Self-pay | Admitting: *Deleted

## 2018-03-18 DIAGNOSIS — R300 Dysuria: Secondary | ICD-10-CM

## 2018-03-18 MED ORDER — FLUCONAZOLE 150 MG PO TABS: 150.0000 mg | ORAL_TABLET | Freq: Once | ORAL | 0 refills | Status: AC

## 2018-03-18 MED ORDER — NITROFURANTOIN MONOHYD MACRO 100 MG PO CAPS: 100.0000 mg | ORAL_CAPSULE | Freq: Two times a day (BID) | ORAL | 0 refills | Status: DC

## 2018-03-18 NOTE — Telephone Encounter (Signed)
The patient did a urine drop off today and asked if she could get the yeast infection pill if she gets an antibiotic called in, please advise, thanks.

## 2018-03-18 NOTE — Telephone Encounter (Signed)
The patient called and stated that she would like her prescription to go to [facility] [place] and also that she would like to pick up her prescription while she's already out. I informed the patient that we may not have the results yet, and a nurse will give her a call back. Please advise.

## 2018-03-18 NOTE — Telephone Encounter (Signed)
Called pt sent in [place] will send ua out for culture

## 2018-03-20 LAB — URINE CULTURE

## 2018-06-26 ENCOUNTER — Telehealth: Payer: Self-pay | Admitting: Obstetrics and Gynecology

## 2018-06-26 ENCOUNTER — Other Ambulatory Visit: Payer: Self-pay | Admitting: *Deleted

## 2018-06-26 MED ORDER — MONTELUKAST SODIUM 10 MG PO TABS: 10.0000 mg | ORAL_TABLET | Freq: Every day | ORAL | 2 refills | Status: DC

## 2018-06-26 MED ORDER — FLUTICASONE PROPIONATE 50 MCG/ACT NA SUSP: 1.0000 | Freq: Every day | NASAL | 2 refills | Status: DC

## 2018-06-26 NOTE — Telephone Encounter (Signed)
Patient called requesting a script for singulair, flonase and cough medicine with codeine. She has been taking over the counter meds with no relief. Thanks

## 2018-06-26 NOTE — Telephone Encounter (Signed)
Called pt she would have to be seen, pt voiced understanding

## 2018-07-01 ENCOUNTER — Ambulatory Visit: Payer: BLUE CROSS/BLUE SHIELD | Admitting: Obstetrics and Gynecology

## 2018-07-01 ENCOUNTER — Encounter: Payer: Self-pay | Admitting: Obstetrics and Gynecology

## 2018-07-01 VITALS — BP 131/85 | HR 60 | Ht 66.0 in | Wt 173.4 lb

## 2018-07-01 DIAGNOSIS — H6982 Other specified disorders of Eustachian tube, left ear: Secondary | ICD-10-CM

## 2018-07-01 DIAGNOSIS — J069 Acute upper respiratory infection, unspecified: Secondary | ICD-10-CM

## 2018-07-01 DIAGNOSIS — Z6827 Body mass index (BMI) 27.0-27.9, adult: Secondary | ICD-10-CM

## 2018-07-01 MED ORDER — ALBUTEROL SULFATE HFA 108 (90 BASE) MCG/ACT IN AERS: 2.0000 | INHALATION_SPRAY | Freq: Four times a day (QID) | RESPIRATORY_TRACT | 2 refills | Status: DC | PRN

## 2018-07-01 MED ORDER — PREDNISONE 10 MG (21) PO TBPK: ORAL_TABLET | ORAL | 0 refills | Status: DC

## 2018-07-01 MED ORDER — HYDROCOD POLST-CPM POLST ER 10-8 MG/5ML PO SUER: 5.0000 mL | Freq: Two times a day (BID) | ORAL | 0 refills | Status: DC | PRN

## 2018-07-01 MED ORDER — FLUTICASONE PROPIONATE 50 MCG/ACT NA SUSP: 2.0000 | Freq: Every day | NASAL | 6 refills | Status: DC

## 2018-07-01 MED ORDER — PHENDIMETRAZINE TARTRATE 35 MG PO TABS: 1.0000 | ORAL_TABLET | Freq: Two times a day (BID) | ORAL | 1 refills | Status: DC

## 2018-07-01 MED ORDER — MONTELUKAST SODIUM 10 MG PO TABS: 10.0000 mg | ORAL_TABLET | Freq: Every day | ORAL | 2 refills | Status: DC

## 2018-07-01 NOTE — Progress Notes (Incomplete)
  Subjective:     Patient ID: Joanne Wade, female   DOB: 16-Sep-1965, 53 y.o.   MRN: [id number]  HPI Here to discuss restarting weight loss medication, as it has worked well for her in past.  Also concerned about lingering productive cough and post nasal drainage. Denies fever and has been talking robitussin, singular and flonase.   Review of Systems  Constitutional: Positive for fatigue.  HENT: Positive for congestion, ear pain, postnasal drip, sinus pressure and sneezing.   Respiratory: Positive for cough.   All other systems reviewed and are negative.      Objective:   Physical Exam A&Ox4 Well groomed female in no distress Blood pressure 131/85, pulse 60, height 5\' 6"  (1.676 m), weight 173 lb 6.4 oz (78.7 kg).  Body mass index is 27.99 kg/m.  Waist 31.5 in      Assessment:     Overweight BMI 27     Plan:     ***

## 2018-07-01 NOTE — Progress Notes (Unsigned)
  Subjective:     Patient ID: Joanne Wade, female   DOB: 1966-03-14, 53 y.o.   MRN: [id number]  HPI Here to discuss restarting weight loss medication, as it has worked well for her in past. She did find that she gets very hungry about 3-4 pm and wondered if she could adjust dose accordingly.  Also concerned about lingering productive cough and post nasal drainage. Denies fever and has been talking robitussin, singular and flonase.   Review of Systems  Constitutional: Positive for fatigue.  HENT: Positive for congestion, ear pain, postnasal drip, sinus pressure and sneezing.   Respiratory: Positive for cough.   All other systems reviewed and are negative.      Objective:   Physical Exam A&Ox4 Well groomed female in no distress Blood pressure 131/85, pulse 60, height '5\' 6"'$  (1.676 m), weight 173 lb 6.4 oz (78.7 kg).  Body mass index is 27.99 kg/m.  Waist 31.5 in Throat with slight redness and clear post nasal drainage noted. Both ears clear on exam, nasal passages boggy with clear discharge noted. Lungs with slight rhales bilaterally HRR       Assessment:     Overweight BMI 27 URI      Plan:     Encouraged continued use of singulair, and flonase with adding tussionex bid prn, prednisone taper, and proair inhaler tid prn.  If symptoms do not improve she will let us know.  Will restart bontril '35mg'$  either early am and 1/2 tablet at noon, or one tablet at 11 am daily. Will continue B12 orally for now. RTC 1 month for wt/bp check.

## 2018-07-31 ENCOUNTER — Telehealth: Payer: Self-pay | Admitting: Obstetrics and Gynecology

## 2018-07-31 NOTE — Telephone Encounter (Signed)
noted 

## 2018-07-31 NOTE — Telephone Encounter (Signed)
The patient cancelled upcoming appt for wt mgmt as she stated it was running up her BP.

## 2018-08-05 ENCOUNTER — Encounter: Payer: BLUE CROSS/BLUE SHIELD | Admitting: Obstetrics and Gynecology

## 2018-09-07 ENCOUNTER — Telehealth: Payer: Self-pay

## 2018-09-08 ENCOUNTER — Ambulatory Visit (INDEPENDENT_AMBULATORY_CARE_PROVIDER_SITE_OTHER): Payer: BLUE CROSS/BLUE SHIELD | Admitting: Family Medicine

## 2018-09-08 ENCOUNTER — Other Ambulatory Visit: Payer: Self-pay

## 2018-09-08 ENCOUNTER — Encounter: Payer: Self-pay | Admitting: Family Medicine

## 2018-09-08 VITALS — BP 130/83 | HR 56 | Temp 97.7°F | Wt 169.0 lb

## 2018-09-08 DIAGNOSIS — G43109 Migraine with aura, not intractable, without status migrainosus: Secondary | ICD-10-CM

## 2018-09-08 DIAGNOSIS — G43909 Migraine, unspecified, not intractable, without status migrainosus: Secondary | ICD-10-CM | POA: Insufficient documentation

## 2018-09-08 MED ORDER — PROMETHAZINE HCL 25 MG PO TABS: 25.0000 mg | ORAL_TABLET | Freq: Four times a day (QID) | ORAL | 1 refills | Status: DC | PRN

## 2018-09-08 MED ORDER — SUMATRIPTAN SUCCINATE 50 MG PO TABS: 50.0000 mg | ORAL_TABLET | Freq: Once | ORAL | 1 refills | Status: DC | PRN

## 2018-09-08 MED ORDER — BUTALBITAL-APAP-CAFFEINE 50-325-40 MG PO TABS: 1.0000 | ORAL_TABLET | Freq: Two times a day (BID) | ORAL | 1 refills | Status: AC | PRN

## 2018-09-08 NOTE — Progress Notes (Signed)
Patient: Joanne Wade Female    DOB: Dec 10, 1965   53 y.o.   MRN: [id number] Visit Date: 09/08/2018  Today's Provider: Lavon Paganini, MD   Chief Complaint  Patient presents with  . Migraine   Subjective:    I, Porsha McClurkin CMA, am acting as a scribe for Lavon Paganini, MD.   Migraine   The current episode started in the past 7 days. The problem occurs constantly. The problem has been resolved. The pain is located in the frontal and bilateral region. The pain does not radiate. The pain quality is similar to prior headaches. The quality of the pain is described as sharp and shooting. The pain is at a severity of 6/10. The pain is mild. Associated symptoms include blurred vision and nausea. The symptoms are aggravated by bright light and activity. She has tried darkened room and acetaminophen for the symptoms. The treatment provided mild relief. Her past medical history is significant for migraine headaches.    Also has cough Her dad thinks the cough syrup with codeine caused the headache No aura with this migraine Unable to get out of bed Started this weekend and lasted for 3 days Now resolved Took phenergan, Fioricet, and imitrex and went to sleep - it was resolved the next day Worried because she was told she had a pituitary cyst in the past - found incidentally - last CT head in 2016 was normal  Allergies  Allergen Reactions  . Morphine Other (See Comments)    Other Reaction: Other reaction  . Belladonna Alkaloids   . Lexapro [Escitalopram Oxalate]   . Morphine And Related Itching     Current Outpatient Medications:  .  albuterol (PROVENTIL HFA;VENTOLIN HFA) 108 (90 Base) MCG/ACT inhaler, Inhale 2 puffs into the lungs every 6 (six) hours as needed for wheezing or shortness of breath., Disp: 1 Inhaler, Rfl: 2 .  aspirin 81 MG tablet, Take by mouth daily., Disp: , Rfl:  .  Biotin 1000 MCG CHEW, Chew by mouth., Disp: , Rfl:  .   butalbital-acetaminophen-caffeine (FIORICET, ESGIC) 50-325-40 MG tablet, Take 1 tablet by mouth 2 (two) times daily as needed for headache (Every 6 hours as needed for headache)., Disp: , Rfl:  .  chlorpheniramine-HYDROcodone (TUSSIONEX PENNKINETIC ER) 10-8 MG/5ML SUER, Take 5 mLs by mouth every 12 (twelve) hours as needed for cough., Disp: 250 mL, Rfl: 0 .  Cholecalciferol (VITAMIN D3) 1.25 MG (50000 UT) CAPS, Take by mouth., Disp: , Rfl:  .  Cyanocobalamin ([facility] B12 GUMMIES) 500 MCG CHEW, Chew by mouth., Disp: , Rfl:  .  fluticasone (FLONASE) 50 MCG/ACT nasal spray, Place 2 sprays into both nostrils daily., Disp: 18.2 g, Rfl: 6 .  montelukast (SINGULAIR) 10 MG tablet, Take 1 tablet (10 mg total) by mouth at bedtime., Disp: 90 tablet, Rfl: 2 .  nitrofurantoin, macrocrystal-monohydrate, (MACROBID) 100 MG capsule, Take 1 capsule (100 mg total) by mouth 2 (two) times daily., Disp: 14 capsule, Rfl: 0 .  Phendimetrazine Tartrate 35 MG TABS, Take 1 tablet (35 mg total) by mouth 2 (two) times daily., Disp: 60 each, Rfl: 1 .  predniSONE (STERAPRED UNI-PAK 21 TAB) 10 MG (21) TBPK tablet, 2 tablets daily for 3 days then one tablet daily x 4 days, Disp: 10 tablet, Rfl: 0 .  promethazine (PHENERGAN) 25 MG tablet, Take 25 mg by mouth every 6 (six) hours as needed for nausea or vomiting., Disp: , Rfl:   Review of Systems  HENT: Negative.  Eyes: Positive for blurred vision.  Cardiovascular: Negative.   Gastrointestinal: Positive for nausea.  Genitourinary: Negative.   Neurological: Negative.     Social History   Tobacco Use  . Smoking status: Never Smoker  . Smokeless tobacco: Never Used  Substance Use Topics  . Alcohol use: Yes    Comment: ocassional      Objective:   BP 130/83 (BP Location: Left Arm, Patient Position: Sitting, Cuff Size: Normal)   Pulse (!) 56   Temp 97.7 F (36.5 C) (Oral)   Wt 169 lb (76.7 kg)   SpO2 98%   BMI 27.28 kg/m  Vitals:   09/08/18 0829  BP: 130/83  Pulse:  (!) 56  Temp: 97.7 F (36.5 C)  TempSrc: Oral  SpO2: 98%  Weight: 169 lb (76.7 kg)     Physical Exam Vitals signs reviewed.  Constitutional:      General: She is not in acute distress.    Appearance: Normal appearance. She is not diaphoretic.  HENT:     Head: Normocephalic and atraumatic.     Nose: Nose normal.     Mouth/Throat:     Mouth: Mucous membranes are moist.     Pharynx: Oropharynx is clear.  Eyes:     General: No scleral icterus.       Right eye: No discharge.        Left eye: No discharge.     Conjunctiva/sclera: Conjunctivae normal.     Pupils: Pupils are equal, round, and reactive to light.  Neck:     Musculoskeletal: Neck supple.  Cardiovascular:     Rate and Rhythm: Normal rate and regular rhythm.     Pulses: Normal pulses.     Heart sounds: Normal heart sounds. No murmur.  Pulmonary:     Effort: Pulmonary effort is normal. No respiratory distress.     Breath sounds: Normal breath sounds. No wheezing or rhonchi.  Abdominal:     General: There is no distension.     Palpations: Abdomen is soft.     Tenderness: There is no abdominal tenderness.  Musculoskeletal:     Right lower leg: No edema.     Left lower leg: No edema.  Lymphadenopathy:     Cervical: No cervical adenopathy.  Skin:    General: Skin is warm and dry.     Capillary Refill: Capillary refill takes less than 2 seconds.     Findings: No rash.  Neurological:     Mental Status: She is alert and oriented to person, place, and time. Mental status is at baseline.     Cranial Nerves: No cranial nerve deficit.     Sensory: No sensory deficit.     Motor: No weakness.     Coordination: Coordination normal.     Gait: Gait normal.  Psychiatric:        Mood and Affect: Mood normal.        Behavior: Behavior normal.         Assessment & Plan   Problem List Items Addressed This Visit      Cardiovascular and Mediastinum   Migraine - Primary    Chronic and intermittent Infrequent - no need  for prophylactic meds currently Can use sumatriptan, fioricet, and/or phenergan as needed for migraines treatment/abortion - discussed roles of each medication Discussed that we could do migraine cocktail in office if she has another persistent migraine Reviewed last CT scan and reassured patient that it appeared normal  Relevant Medications   SUMAtriptan (IMITREX) 50 MG tablet   butalbital-acetaminophen-caffeine (FIORICET, ESGIC) 50-325-40 MG tablet       The entirety of the information documented in the History of Present Illness, Review of Systems and Physical Exam were personally obtained by me. Portions of this information were initially documented by [facility], CMA and reviewed by me for thoroughness and accuracy.    Virginia Crews, MD, MPH [facility] 09/09/2018 1:10 PM

## 2018-09-08 NOTE — Patient Instructions (Signed)

## 2018-09-09 NOTE — Assessment & Plan Note (Signed)
Chronic and intermittent Infrequent - no need for prophylactic meds currently Can use sumatriptan, fioricet, and/or phenergan as needed for migraines treatment/abortion - discussed roles of each medication Discussed that we could do migraine cocktail in office if she has another persistent migraine Reviewed last CT scan and reassured patient that it appeared normal

## 2018-09-14 ENCOUNTER — Telehealth: Payer: Self-pay | Admitting: Family Medicine

## 2018-09-14 MED ORDER — EPINEPHRINE 0.3 MG/0.3ML IJ SOAJ: 0.3000 mg | INTRAMUSCULAR | 0 refills | Status: DC | PRN

## 2018-09-14 NOTE — Telephone Encounter (Signed)
Called saying she had an allergic reaction last night which she thinks was from peanut oil.  She had the hives and took benadryl. She also felt her throat swelling a little but it all got better with the benadryl.    She is better but wants to know if she can get a rx for epi pen.  She said can she have a couple for her different homes she has.  She said she was just in last week and did not want to come in today  [facility] uses [facility]  CB#  [phone]  Thanks Con Memos

## 2018-09-14 NOTE — Telephone Encounter (Signed)
Rx sent for epi pen 2 pack She should consider seeing Allergy in the future as well

## 2018-09-14 NOTE — Telephone Encounter (Signed)
Patient advised.

## 2018-09-23 NOTE — Telephone Encounter (Signed)
Opened in error

## 2018-10-12 ENCOUNTER — Other Ambulatory Visit: Payer: Self-pay | Admitting: Family Medicine

## 2018-10-12 DIAGNOSIS — Z1231 Encounter for screening mammogram for malignant neoplasm of breast: Secondary | ICD-10-CM

## 2018-10-20 ENCOUNTER — Encounter: Payer: BLUE CROSS/BLUE SHIELD | Admitting: Obstetrics and Gynecology

## 2018-10-21 ENCOUNTER — Encounter: Payer: BLUE CROSS/BLUE SHIELD | Admitting: Obstetrics and Gynecology

## 2018-11-23 ENCOUNTER — Encounter: Payer: BLUE CROSS/BLUE SHIELD | Admitting: Family Medicine

## 2018-12-02 ENCOUNTER — Other Ambulatory Visit: Payer: Self-pay

## 2018-12-02 ENCOUNTER — Ambulatory Visit
Admission: RE | Admit: 2018-12-02 | Discharge: 2018-12-02 | Disposition: A | Discharge status: Home / Self Care | Payer: BLUE CROSS/BLUE SHIELD | Source: Ambulatory Visit | Attending: Family Medicine | Admitting: Family Medicine

## 2018-12-02 ENCOUNTER — Telehealth: Payer: Self-pay

## 2018-12-02 DIAGNOSIS — Z1231 Encounter for screening mammogram for malignant neoplasm of breast: Secondary | ICD-10-CM | POA: Insufficient documentation

## 2018-12-02 NOTE — Telephone Encounter (Signed)
Patient was advised.  

## 2018-12-02 NOTE — Telephone Encounter (Signed)
-----   Message from Virginia Crews, MD sent at 12/02/2018  3:03 PM EDT ----- Normal mammogram. Repeat in 1 yr

## 2018-12-07 ENCOUNTER — Telehealth: Payer: Self-pay

## 2018-12-07 NOTE — Telephone Encounter (Signed)
Coronavirus (COVID-19) Are you at risk?  Are you at risk for the Coronavirus (COVID-19)?  To be considered HIGH RISK for Coronavirus (COVID-19), you have to meet the following criteria:  . Traveled to [place], [place], [place], [place] or [place]; or in the [place] to [place], [place], [place], or [place]; and have fever, cough, and shortness of breath within the last 2 weeks of travel OR . Been in close contact with a person diagnosed with COVID-19 within the last 2 weeks and have fever, cough, and shortness of breath . IF YOU DO NOT MEET THESE CRITERIA, YOU ARE CONSIDERED LOW RISK FOR COVID-19.  What to do if you are HIGH RISK for COVID-19?  Marland Kitchen If you are having a medical emergency, call 911. . Seek medical care right away. Before you go to a doctor's office, urgent care or emergency department, call ahead and tell them about your recent travel, contact with someone diagnosed with COVID-19, and your symptoms. You should receive instructions from your physician's office regarding next steps of care.  . When you arrive at healthcare provider, tell the healthcare staff immediately you have returned from visiting [place], [place], [place], [place] or [place]; or traveled in the [place] to [place], [place], [place], or [place]; in the last two weeks or you have been in close contact with a person diagnosed with COVID-19 in the last 2 weeks.   . Tell the health care staff about your symptoms: fever, cough and shortness of breath. . After you have been seen by a medical provider, you will be either: o Tested for (COVID-19) and discharged home on quarantine except to seek medical care if symptoms worsen, and asked to  - Stay home and avoid contact with others until you get your results (4-5 days)  - Avoid travel on public transportation if possible (such as bus, train, or airplane) or o Sent to the Emergency Department by EMS for evaluation, COVID-19 testing, and possible  admission depending on your condition and test results.  What to do if you are LOW RISK for COVID-19?  Reduce your risk of any infection by using the same precautions used for avoiding the common cold or flu:  Marland Kitchen Wash your hands often with soap and warm water for at least 20 seconds.  If soap and water are not readily available, use an alcohol-based hand sanitizer with at least 60% alcohol.  . If coughing or sneezing, cover your mouth and nose by coughing or sneezing into the elbow areas of your shirt or coat, into a tissue or into your sleeve (not your hands). . Avoid shaking hands with others and consider head nods or verbal greetings only. . Avoid touching your eyes, nose, or mouth with unwashed hands.  . Avoid close contact with people who are Rishita Petron. . Avoid places or events with large numbers of people in one location, like concerts or sporting events. . Carefully consider travel plans you have or are making. . If you are planning any travel outside or inside the [place], visit the CDC's Travelers' Health webpage for the latest health notices. . If you have some symptoms but not all symptoms, continue to monitor at home and seek medical attention if your symptoms worsen. . If you are having a medical emergency, call 911.  12/07/18 SCREENING NEG SLS ADDITIONAL HEALTHCARE OPTIONS FOR PATIENTS  [facility] / e-Visit: [email or web address]         [facility]: [phone]  [facility]: [phone]                   [facility]: [phone]  

## 2018-12-08 ENCOUNTER — Ambulatory Visit (INDEPENDENT_AMBULATORY_CARE_PROVIDER_SITE_OTHER): Payer: BLUE CROSS/BLUE SHIELD | Admitting: Obstetrics and Gynecology

## 2018-12-08 ENCOUNTER — Other Ambulatory Visit: Payer: Self-pay

## 2018-12-08 ENCOUNTER — Encounter: Payer: Self-pay | Admitting: Obstetrics and Gynecology

## 2018-12-08 VITALS — BP 131/83 | HR 56 | Ht 66.0 in | Wt 162.3 lb

## 2018-12-08 DIAGNOSIS — N9089 Other specified noninflammatory disorders of vulva and perineum: Secondary | ICD-10-CM | POA: Diagnosis not present

## 2018-12-08 DIAGNOSIS — B009 Herpesviral infection, unspecified: Secondary | ICD-10-CM

## 2018-12-08 MED ORDER — ACYCLOVIR 400 MG PO TABS: 400.0000 mg | ORAL_TABLET | Freq: Two times a day (BID) | ORAL | 1 refills | Status: DC

## 2018-12-08 NOTE — Progress Notes (Signed)
  Subjective:     Patient ID: Kathrene Bongo, female   DOB: 1966-06-01, 53 y.o.   MRN: [id number]  HPI Shaved last week and felt a nick, that swelled up and itched. Feels a little better today. Used episom salt and monistat and neosporin.  Does report h/o fever blisters, none recently, and is sexually active with female spouse. They do have oral sex and reports one episode a week before this started.  Review of Systems  All other systems reviewed and are negative.      Objective:   Physical Exam A&Ox4 Well groomed female in no distress Blood pressure 131/83, pulse (!) 56, height 5\' 6"  (1.676 m), weight 162 lb 4.8 oz (73.6 kg). Pelvic exam: VULVA: vulvar lesion on rim of left labia minora and in folds of left labia minora and majora c/w herpes, VAGINA: normal appearing vagina with normal color and discharge, no lesions.    Assessment:     Vulvar lesion HSV1    Plan:     Explained findings that are c/w genital HSV1 outbreak. Prescribed acyclovir 400mg  bid x 1 week and then prn use. Declined labs at this time. To abstain from shaving at this time.  RTC as needed.  Abdoulie Tierce,CNM

## 2018-12-08 NOTE — Patient Instructions (Signed)
Genital Herpes °Genital herpes is a common sexually transmitted infection (STI) that is caused by a virus. The virus spreads from person to person through sexual contact. Infection can cause itching, blisters, and sores around the genitals or rectum. Symptoms may last several days and then go away This is called an outbreak. However, the virus remains in your body, so you may have more outbreaks in the future. The time between outbreaks varies and can be months or years. °Genital herpes affects men and women. It is particularly concerning for pregnant women because the virus can be passed to the baby during delivery and can cause serious problems. Genital herpes is also a concern for people who have a weak disease-fighting (immune) system. °What are the causes? °This condition is caused by the herpes simplex virus (HSV) type 1 or type 2. The virus may spread through: °· Sexual contact with an infected person, including vaginal, anal, and oral sex. °· Contact with fluid from a herpes sore. °· The skin. This means that you can get herpes from an infected partner even if he or she does not have a visible sore or does not know that he or she is infected. °What increases the risk? °You are more likely to develop this condition if: °· You have sex with many partners. °· You do not use latex condoms during sex. °What are the signs or symptoms? °Most people do not have symptoms (asymptomatic) or have mild symptoms that may be mistaken for other skin problems. Symptoms may include: °· Small red bumps near the genitals, rectum, or mouth. These bumps turn into blisters and then turn into sores. °· Flu-like symptoms, including: °? Fever. °? Body aches. °? Swollen lymph nodes. °? Headache. °· Painful urination. °· Pain and itching in the genital area or rectal area. °· Vaginal discharge. °· Tingling or shooting pain in the legs and buttocks. °Generally, symptoms are more severe and last longer during the first (primary)  outbreak. Flu-like symptoms are also more common during the primary outbreak. °How is this diagnosed? °Genital herpes may be diagnosed based on: °· A physical exam. °· Your medical history. °· Blood tests. °· A test of a fluid sample (culture) from an open sore. °How is this treated? °There is no cure for this condition, but treatment with antiviral medicines that are taken by mouth (orally) can do the following: °· Speed up healing and relieve symptoms. °· Help to reduce the spread of the virus to sexual partners. °· Limit the chance of future outbreaks, or make future outbreaks shorter. °· Lessen symptoms of future outbreaks. °Your health care provider may also recommend pain relief medicines, such as aspirin or ibuprofen. °Follow these instructions at home: °Sexual activity °· Do not have sexual contact during active outbreaks. °· Practice safe sex. Latex condoms and female condoms may help prevent the spread of the herpes virus. °General instructions °· Keep the affected areas dry and clean. °· Take over-the-counter and prescription medicines only as told by your health care provider. °· Avoid rubbing or touching blisters and sores. If you do touch blisters or sores: °? Wash your hands thoroughly with soap and water. °? Do not touch your eyes afterward. °· To help relieve pain or itching, you may take the following actions as directed by your health care provider: °? Apply a cold, wet cloth (cold compress) to affected areas 4-6 times a day. °? Apply a substance that protects your skin and reduces bleeding (astringent). °? Apply a gel that   helps relieve pain around sores (lidocaine gel). °? Take a warm, shallow bath that cleans the genital area (sitz bath). °· Keep all follow-up visits as told by your health care provider. This is important. °How is this prevented? °· Use condoms. Although anyone can get genital herpes during sexual contact, even with the use of a condom, a condom can provide some  protection. °· Avoid having multiple sexual partners. °· Talk with your sexual partner about any symptoms either of you may have. Also, talk with your partner about any history of STIs. °· Get tested for STIs before you have sex. Ask your partner to do the same. °· Do not have sexual contact if you have symptoms of genital herpes. °Contact a health care provider if: °· Your symptoms are not improving with medicine. °· Your symptoms return. °· You have new symptoms. °· You have a fever. °· You have abdominal pain. °· You have redness, swelling, or pain in your eye. °· You notice new sores on other parts of your body. °· You are a woman and experience bleeding between menstrual periods. °· You have had herpes and you become pregnant or plan to become pregnant. °Summary °· Genital herpes is a common sexually transmitted infection (STI) that is caused by the herpes simplex virus (HSV) type 1 or type 2. °· These viruses are most often spread through sexual contact with an infected person. °· You are more likely to develop this condition if you have sex with many partners or you have unprotected sex. °· Most people do not have symptoms (asymptomatic) or have mild symptoms that may be mistaken for other skin problems. Symptoms occur as outbreaks that may happen months or years apart. °· There is no cure for this condition, but treatment with oral antiviral medicines can reduce symptoms, reduce the chance of spreading the virus to a partner, prevent future outbreaks, or shorten future outbreaks. °This information is not intended to replace advice given to you by your health care provider. Make sure you discuss any questions you have with your health care provider. °Document Released: 06/07/2000 Document Revised: 05/10/2016 Document Reviewed: 05/10/2016 °Elsevier Interactive Patient Education © 2019 [employer] Inc. ° °

## 2019-02-08 ENCOUNTER — Telehealth: Payer: Self-pay

## 2019-02-08 NOTE — Telephone Encounter (Signed)
Coronavirus (COVID-19) Are you at risk?  Are you at risk for the Coronavirus (COVID-19)?  To be considered HIGH RISK for Coronavirus (COVID-19), you have to meet the following criteria:  . Traveled to [place], [place], [place], [place] or [place]; or in the [place] to [place], [place], [place], or [place]; and have fever, cough, and shortness of breath within the last 2 weeks of travel OR . Been in close contact with a person diagnosed with COVID-19 within the last 2 weeks and have fever, cough, and shortness of breath . IF YOU DO NOT MEET THESE CRITERIA, YOU ARE CONSIDERED LOW RISK FOR COVID-19.  What to do if you are HIGH RISK for COVID-19?  Marland Kitchen If you are having a medical emergency, call 911. . Seek medical care right away. Before you go to a doctor's office, urgent care or emergency department, call ahead and tell them about your recent travel, contact with someone diagnosed with COVID-19, and your symptoms. You should receive instructions from your physician's office regarding next steps of care.  . When you arrive at healthcare provider, tell the healthcare staff immediately you have returned from visiting [place], [place], [place], [place] or [place]; or traveled in the [place] to [place], [place], [place], or [place]; in the last two weeks or you have been in close contact with a person diagnosed with COVID-19 in the last 2 weeks.   . Tell the health care staff about your symptoms: fever, cough and shortness of breath. . After you have been seen by a medical provider, you will be either: o Tested for (COVID-19) and discharged home on quarantine except to seek medical care if symptoms worsen, and asked to  - Stay home and avoid contact with others until you get your results (4-5 days)  - Avoid travel on public transportation if possible (such as bus, train, or airplane) or o Sent to the Emergency Department by EMS for evaluation, COVID-19 testing, and possible  admission depending on your condition and test results.  What to do if you are LOW RISK for COVID-19?  Reduce your risk of any infection by using the same precautions used for avoiding the common cold or flu:  Marland Kitchen Wash your hands often with soap and warm water for at least 20 seconds.  If soap and water are not readily available, use an alcohol-based hand sanitizer with at least 60% alcohol.  . If coughing or sneezing, cover your mouth and nose by coughing or sneezing into the elbow areas of your shirt or coat, into a tissue or into your sleeve (not your hands). . Avoid shaking hands with others and consider head nods or verbal greetings only. . Avoid touching your eyes, nose, or mouth with unwashed hands.  . Avoid close contact with people who are Danton Palmateer. . Avoid places or events with large numbers of people in one location, like concerts or sporting events. . Carefully consider travel plans you have or are making. . If you are planning any travel outside or inside the [place], visit the CDC's Travelers' Health webpage for the latest health notices. . If you have some symptoms but not all symptoms, continue to monitor at home and seek medical attention if your symptoms worsen. . If you are having a medical emergency, call 911.  02/08/19 SCREENING NEG SLS ADDITIONAL HEALTHCARE OPTIONS FOR PATIENTS  [facility] / e-Visit: [email or web address]         [facility]: [phone]  [facility]: [phone]                   [facility]: [phone]  

## 2019-02-09 ENCOUNTER — Encounter: Payer: Self-pay | Admitting: Obstetrics and Gynecology

## 2019-02-09 ENCOUNTER — Ambulatory Visit (INDEPENDENT_AMBULATORY_CARE_PROVIDER_SITE_OTHER): Payer: BLUE CROSS/BLUE SHIELD | Admitting: Obstetrics and Gynecology

## 2019-02-09 ENCOUNTER — Other Ambulatory Visit: Payer: Self-pay

## 2019-02-09 ENCOUNTER — Other Ambulatory Visit (HOSPITAL_COMMUNITY)
Admission: RE | Admit: 2019-02-09 | Discharge: 2019-02-09 | Disposition: A | Discharge status: Home / Self Care | Payer: BLUE CROSS/BLUE SHIELD | Source: Ambulatory Visit | Attending: Obstetrics and Gynecology | Admitting: Obstetrics and Gynecology

## 2019-02-09 VITALS — BP 123/83 | HR 71 | Ht 66.0 in | Wt 165.1 lb

## 2019-02-09 DIAGNOSIS — Z01419 Encounter for gynecological examination (general) (routine) without abnormal findings: Secondary | ICD-10-CM

## 2019-02-09 DIAGNOSIS — N951 Menopausal and female climacteric states: Secondary | ICD-10-CM | POA: Diagnosis not present

## 2019-02-09 NOTE — Progress Notes (Signed)
SUBJECTIVE:  53 y.o. caucasian female for annual routine pap and checkup with no concerns.  Pt reports Hysterectomy in 2005, uterus and b/l ovaries removed, cervical cuff intact. Pt reports part of cervix taken in 2010 for abnormal pap, but has had normal paps since then. Requests pap today, last pap 2018 WNL.  Discussed need for bone density scan, pt reports previous scan done 10-15 years ago and told she had some bone density loss at that time. Advised to continue taking vit D, calcium, and multivitamin. Colonoscopy 09/2017 WNL and mammogram 06/20 WNL.   Current Outpatient Medications  Medication Sig Dispense Refill  . acyclovir (ZOVIRAX) 400 MG tablet Take 1 tablet (400 mg total) by mouth 2 (two) times daily. 30 tablet 1  . aspirin 81 MG tablet Take by mouth daily.    . Biotin 1000 MCG CHEW Chew by mouth.    . Cholecalciferol (VITAMIN D3) 1.25 MG (50000 UT) CAPS Take by mouth.    . Cyanocobalamin ([employer] B12 GUMMIES) [place] by mouth.    . fluticasone (FLONASE) 50 MCG/ACT nasal spray Place 2 sprays into both nostrils daily. 18.2 g 6  . montelukast (SINGULAIR) 10 MG tablet Take 1 tablet (10 mg total) by mouth at bedtime. 90 tablet 2  . albuterol (PROVENTIL HFA;VENTOLIN HFA) 108 (90 Base) MCG/ACT inhaler Inhale 2 puffs into the lungs every 6 (six) hours as needed for wheezing or shortness of breath. (Patient not taking: Reported on 02/09/2019) 1 Inhaler 2  . butalbital-acetaminophen-caffeine (FIORICET, ESGIC) 50-325-40 MG tablet Take 1 tablet by mouth 2 (two) times daily as needed for headache (Every 6 hours as needed for headache). (Patient not taking: Reported on 12/08/2018) 20 tablet 1  . EPINEPHrine 0.3 mg/0.3 mL IJ SOAJ injection Inject 0.3 mLs (0.3 mg total) into the muscle as needed for anaphylaxis. (Patient not taking: Reported on 12/08/2018) 2 Device 0   No current facility-administered medications for this visit.    Allergies: Morphine, Belladonna alkaloids, Lexapro [escitalopram  oxalate], and Morphine and related  No LMP recorded. Patient has had a hysterectomy.  ROS:  Feeling well. No dyspnea or chest pain on exertion.  No abdominal pain, change in bowel habits, black or bloody stools.  No urinary tract symptoms. GYN ROS: no breast pain or new or enlarging lumps on self exam, no vaginal bleeding. No neurological complaints.  OBJECTIVE:  The patient appears well, alert, oriented x 3, in no distress. BP 123/83   Pulse 71   Ht 5\' 6"  (1.676 m)   Wt 74.9 kg   BMI 26.65 kg/m  ENT normal.  Neck supple. No adenopathy or thyromegaly. PERLA. Lungs are clear, good air entry, no wheezes, rhonchi or rales. S1 and S2 normal, no murmurs, regular rate and rhythm. Abdomen soft without tenderness, guarding, mass or organomegaly. Extremities show no edema, normal peripheral pulses. Neurological is normal, no focal findings.  BREAST EXAM: breasts appear normal, no suspicious masses, no skin or nipple changes or axillary nodes  PELVIC EXAM: normal external genitalia, vulva, vagina, cervical cuff. Pap with HPV reflex performed  ASSESSMENT:  well woman Menopausal state  PLAN:  return in 1 yr for AE or sooner if needed Labs ordered Bone Density Scan encouraged

## 2019-02-10 LAB — COMPREHENSIVE METABOLIC PANEL
ALT: 14 IU/L (ref 0–32)
AST: 24 IU/L (ref 0–40)
Albumin/Globulin Ratio: 2 (ref 1.2–2.2)
Albumin: 4.8 g/dL (ref 3.8–4.9)
Alkaline Phosphatase: 85 IU/L (ref 39–117)
BUN/Creatinine Ratio: 21 (ref 9–23)
BUN: 23 mg/dL (ref 6–24)
Bilirubin Total: 0.2 mg/dL (ref 0.0–1.2)
CO2: 24 mmol/L (ref 20–29)
Calcium: 9.3 mg/dL (ref 8.7–10.2)
Chloride: 103 mmol/L (ref 96–106)
Creatinine, Ser: 1.1 mg/dL — ABNORMAL HIGH (ref 0.57–1.00)
GFR calc Af Amer: 67 mL/min/{1.73_m2} (ref >59)
GFR calc non Af Amer: 58 mL/min/{1.73_m2} — ABNORMAL LOW (ref >59)
Globulin, Total: 2.4 g/dL (ref 1.5–4.5)
Glucose: 97 mg/dL (ref 65–99)
Potassium: 4.5 mmol/L (ref 3.5–5.2)
Sodium: 141 mmol/L (ref 134–144)
Total Protein: 7.2 g/dL (ref 6.0–8.5)

## 2019-02-10 LAB — LIPID PANEL
Chol/HDL Ratio: 3.7 ratio (ref 0.0–4.4)
Cholesterol, Total: 231 mg/dL — ABNORMAL HIGH (ref 100–199)
HDL: 63 mg/dL (ref >39)
LDL Calculated: 151 mg/dL — ABNORMAL HIGH (ref 0–99)
Triglycerides: 87 mg/dL (ref 0–149)
VLDL Cholesterol Cal: 17 mg/dL (ref 5–40)

## 2019-02-10 LAB — HEMOGLOBIN A1C
Est. average glucose Bld gHb Est-mCnc: 111 mg/dL
Hgb A1c MFr Bld: 5.5 % (ref 4.8–5.6)

## 2019-02-10 LAB — VITAMIN D 25 HYDROXY (VIT D DEFICIENCY, FRACTURES): Vit D, 25-Hydroxy: 51.4 ng/mL (ref 30.0–100.0)

## 2019-02-10 LAB — TSH: TSH: 1.58 u[IU]/mL (ref 0.450–4.500)

## 2019-02-11 LAB — CYTOLOGY - PAP: Diagnosis: NEGATIVE

## 2019-02-16 ENCOUNTER — Other Ambulatory Visit: Payer: Self-pay | Admitting: Obstetrics and Gynecology

## 2019-02-16 ENCOUNTER — Telehealth: Payer: Self-pay | Admitting: Obstetrics and Gynecology

## 2019-02-16 MED ORDER — PREDNISONE 10 MG (21) PO TBPK: ORAL_TABLET | ORAL | 2 refills | Status: DC

## 2019-02-16 NOTE — Telephone Encounter (Signed)
The patient called and stated that she is hoping to have her medication sent in while she is out in town please advise and see previous MyChart message.

## 2019-02-17 NOTE — Telephone Encounter (Signed)
Mel took care of

## 2019-04-08 ENCOUNTER — Telehealth: Payer: Self-pay | Admitting: Family Medicine

## 2019-04-08 NOTE — Telephone Encounter (Signed)
Dr B, Just an FYI because I am not sure that there is anything that we need to do here. I don't think there needed to a message sent as the patient was on her way to the walk in clinic per Con Memos.

## 2019-04-08 NOTE — Telephone Encounter (Signed)
Noted  

## 2019-04-08 NOTE — Telephone Encounter (Signed)
Pt called thinking she has fluid on her ear.  She is flying tomorrow and wants to know what she can do.  There are no appts left today.  She talked about going on to the walk in clinic [address].  She feels that someone does needs to look in her ear.  She did ask for me to send a call back just incase she cant get into the walk in clinic.      CB#  [phone]  Thanks  Con Memos

## 2019-06-21 ENCOUNTER — Telehealth: Payer: Self-pay

## 2019-06-21 ENCOUNTER — Ambulatory Visit (INDEPENDENT_AMBULATORY_CARE_PROVIDER_SITE_OTHER): Payer: BLUE CROSS/BLUE SHIELD | Admitting: Obstetrics and Gynecology

## 2019-06-21 ENCOUNTER — Other Ambulatory Visit: Payer: Self-pay

## 2019-06-21 VITALS — BP 128/77 | HR 66 | Ht 66.0 in | Wt 166.4 lb

## 2019-06-21 DIAGNOSIS — R399 Unspecified symptoms and signs involving the genitourinary system: Secondary | ICD-10-CM

## 2019-06-21 LAB — POCT URINALYSIS DIPSTICK
Bilirubin, UA: NEGATIVE
Glucose, UA: NEGATIVE
Ketones, UA: NEGATIVE
Nitrite, UA: NEGATIVE
Protein, UA: NEGATIVE
Spec Grav, UA: <=1.005 — AB (ref 1.010–1.025)
Urobilinogen, UA: 0.2 E.U./dL
pH, UA: 6 (ref 5.0–8.0)

## 2019-06-21 NOTE — Telephone Encounter (Signed)
Spoke with pt concerning her UTI symptoms. Pt stated that she was having back pain and frequent urination. Pt scheduled to come into the office this evening to see the nurse.

## 2019-06-21 NOTE — Telephone Encounter (Signed)
Patient is feeling like she's experiencing UTI symptoms. Would like to have a urine sample completed asap

## 2019-06-21 NOTE — Progress Notes (Signed)
Pt present due to having uti symptoms. Pt stated having lower back pain and frequent urination. Pt has not tried any type of medication for the symptoms. UA completed. Pt requested for her urine to be sent to lab for testing. Pt requested for antibiotic to treat her symptoms even if her cultures came back negative.    BP 128/77   Pulse 66   Ht 5\' 6"  (1.676 m)   Wt 166 lb 6.4 oz (75.5 kg)   BMI 26.86 kg/m

## 2019-06-22 ENCOUNTER — Telehealth: Payer: Self-pay

## 2019-06-22 MED ORDER — CIPROFLOXACIN HCL 250 MG PO TABS: 250.0000 mg | ORAL_TABLET | Freq: Two times a day (BID) | ORAL | 0 refills | Status: DC

## 2019-06-22 NOTE — Telephone Encounter (Signed)
Pt called and is informed that her prescription for Cipro 250 mg BID x 3 days was sent to her pharmacy.

## 2019-06-23 LAB — URINE CULTURE: Organism ID, Bacteria: NO GROWTH

## 2019-07-05 ENCOUNTER — Encounter: Payer: Self-pay | Admitting: Obstetrics and Gynecology

## 2020-02-15 ENCOUNTER — Encounter: Payer: BLUE CROSS/BLUE SHIELD | Admitting: Obstetrics and Gynecology

## 2020-02-29 ENCOUNTER — Other Ambulatory Visit: Payer: Self-pay

## 2020-02-29 ENCOUNTER — Ambulatory Visit (INDEPENDENT_AMBULATORY_CARE_PROVIDER_SITE_OTHER): Payer: 59 | Admitting: Obstetrics and Gynecology

## 2020-02-29 ENCOUNTER — Encounter: Payer: Self-pay | Admitting: Obstetrics and Gynecology

## 2020-03-02 ENCOUNTER — Telehealth (INDEPENDENT_AMBULATORY_CARE_PROVIDER_SITE_OTHER): Payer: 59 | Admitting: Family Medicine

## 2020-03-02 DIAGNOSIS — R058 Other specified cough: Secondary | ICD-10-CM

## 2020-03-02 DIAGNOSIS — R05 Cough: Secondary | ICD-10-CM | POA: Diagnosis not present

## 2020-03-02 DIAGNOSIS — Z7189 Other specified counseling: Secondary | ICD-10-CM

## 2020-03-02 DIAGNOSIS — K5909 Other constipation: Secondary | ICD-10-CM | POA: Diagnosis not present

## 2020-03-02 DIAGNOSIS — Z7185 Encounter for immunization safety counseling: Secondary | ICD-10-CM

## 2020-03-02 DIAGNOSIS — Z1231 Encounter for screening mammogram for malignant neoplasm of breast: Secondary | ICD-10-CM | POA: Diagnosis not present

## 2020-03-02 MED ORDER — FLUTICASONE PROPIONATE 50 MCG/ACT NA SUSP: 2.0000 | Freq: Every day | NASAL | 6 refills | Status: DC

## 2020-03-02 MED ORDER — HYDROCODONE-HOMATROPINE 5-1.5 MG/5ML PO SYRP: 5.0000 mL | ORAL_SOLUTION | Freq: Three times a day (TID) | ORAL | 0 refills | Status: DC | PRN

## 2020-03-02 MED ORDER — MONTELUKAST SODIUM 10 MG PO TABS: 10.0000 mg | ORAL_TABLET | Freq: Every day | ORAL | 2 refills | Status: DC

## 2020-03-02 MED ORDER — ALBUTEROL SULFATE HFA 108 (90 BASE) MCG/ACT IN AERS: 2.0000 | INHALATION_SPRAY | Freq: Four times a day (QID) | RESPIRATORY_TRACT | 0 refills | Status: DC | PRN

## 2020-03-02 NOTE — Progress Notes (Signed)
MyChart Video Visit    Virtual Visit via Video Note   This visit type was conducted due to national recommendations for restrictions regarding the COVID-19 Pandemic (e.g. social distancing) in an effort to limit this patient's exposure and mitigate transmission in our community. This patient is at least at moderate risk for complications without adequate follow up. This format is felt to be most appropriate for this patient at this time. Physical exam was limited by quality of the video and audio technology used for the visit.    Patient location: home Provider location: [place] involved in the visit: patient, provider  I discussed the limitations of evaluation and management by telemedicine and the availability of in person appointments. The patient expressed understanding and agreed to proceed.    Patient: Joanne Wade   DOB: 1965-07-10   54 y.o. Female  MRN: [id number] Visit Date: 03/02/2020  Today's healthcare provider: Lavon Paganini, MD   Chief Complaint  Patient presents with  . URI   Subjective    HPI   COVID on 8/15 felt bad for 2 weeks and then got better  Had pap scheduled on Tuesday and noted ongoing congestion and cough and was advised to reschedule and see PCP  Cough comes and goes. Worse in the evenings. Nyquil not helping.  Cough syrup with codeine helped.  Feels phlegm in the throat.   Also reports trouble with lifelong constipation.  Can go for days between bowel movements.  Using an OTC laxative as needed.  Saw a commercial about Linzess and wonders if this would be a good option for her.  She is never taken MiraLAX regularly.  Last colonoscopy in 2019 was normal  She reports she is overdue for her mammogram.  She was unable to be seen OB/GYN.  She is also wondering if we can see her to do her physical and Pap smear this year.  As her midwife that she was previously seeing is no longer with the practice.  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 Smoking status: Never Smoker  . Smokeless tobacco: Never Used  Vaping Use  . Vaping Use: Never used  Substance Use Topics  . Alcohol use: Yes    Comment: ocassional  . Drug use: No      Medications: Outpatient Medications Prior to Visit  Medication Sig  . Biotin 1000 MCG CHEW Chew 10,000 mg by mouth.   . butalbital-acetaminophen-caffeine (FIORICET, ESGIC) 50-325-40 MG tablet Take 1 tablet by mouth 2 (two) times daily as needed for headache (Every 6 hours as needed for headache). (Patient not taking: Reported on 02/29/2020)  . EPINEPHrine 0.3 mg/0.3 mL IJ SOAJ injection Inject 0.3 mLs (0.3 mg total) into the muscle as needed for anaphylaxis.  . Multiple Vitamin (MULTIVITAMIN) tablet Take 1 tablet by mouth daily.  . Zinc Citrate-Phytase 25-500 MG CAPS Take by mouth.  . [DISCONTINUED] albuterol (PROVENTIL HFA;VENTOLIN HFA) 108 (90 Base) MCG/ACT inhaler Inhale 2 puffs into the lungs every 6 (six) hours as needed for wheezing or shortness of breath. (Patient not taking: Reported on 02/29/2020)  . [DISCONTINUED] fluticasone (FLONASE) 50 MCG/ACT nasal spray Place 2 sprays into both nostrils daily.  . [DISCONTINUED] montelukast (SINGULAIR) 10 MG tablet Take 1 tablet (10 mg total) by mouth at bedtime.   No facility-administered medications prior to visit.    Review of Systems     Objective  There were no vitals taken for this visit. BP Readings from Last 3 Encounters:  02/29/20 110/74  06/21/19 128/77  02/09/19 123/83   Wt Readings from Last 3 Encounters:  02/29/20 168 lb 2 oz (76.3 kg)  06/21/19 166 lb 6.4 oz (75.5 kg)  02/09/19 165 lb 1.6 oz (74.9 kg)      Physical Exam Constitutional:      General: She is not in acute distress.    Appearance: She is well-developed.  HENT:     Head: Normocephalic and atraumatic.  Eyes:     General: No scleral icterus.     Conjunctiva/sclera: Conjunctivae normal.  Pulmonary:     Effort: Pulmonary effort is normal. No respiratory distress.  Neurological:     Mental Status: She is alert and oriented to person, place, and time. Mental status is at baseline.  Psychiatric:        Behavior: Behavior normal.        Assessment & Plan     1. Post-viral cough syndrome -Seems to have post viral cough syndrome after recent episode of Covid illness -She is outside of the infectious window and no longer febrile, so she is okay to be seen in offices and return to work -Discussed that post viral cough can linger for several weeks -If she spikes any fever, will get chest x-ray -Can use Flonase to help with postnasal drip, cough syrup/honey to help with cough, and albuterol as needed for any wheezing/tightness -Discussed return precautions  2. Breast cancer screening by mammogram -Mammogram ordered today -Breast exam at upcoming CPE - MM 3D SCREEN BREAST BILATERAL; Future  3. Chronic constipation -Longstanding issue -Discussed importance of regular treatment as opposed to as needed laxatives after being constipated for 4 days -Discussed goal of 1 soft bowel movement daily -Start MiraLAX daily and titrate as needed to obtain 1 soft bowel movement daily -At CPE, if constipation persists despite MiraLAX use and increasing fiber in her diet, can consider Linzess  4. Vaccine counseling - counseled on safety and efficacy of COVID19 vaccines - discussed that these were large vaccine trials and all steps were taken to ensure safety - dicussed that we have not seen side effects outside of the 39-month window of vaccines in the past -Encouraged her to proceed with COVID-19 vaccine as her  reaction from COVID-19 infection would likely be much more severe than from COVID-19 vaccination -Discussed population health and the importance of vaccines to achieve herd immunity -Answered any questions and concerns that she  had about  the vaccine   Return in about 2 months (around 05/02/2020) for CPE.     I discussed the assessment and treatment plan with the patient. The patient was provided an opportunity to ask questions and all were answered. The patient agreed with the plan and demonstrated an understanding of the instructions.   The patient was advised to call back or seek an in-person evaluation if the symptoms worsen or if the condition fails to improve as anticipated.  I provided 30 minutes of non-face-to-face time during this encounter.  Lavon Paganini, MD [facility] [phone] (phone) [phone] (fax)  [place]

## 2020-03-03 ENCOUNTER — Encounter: Payer: Self-pay | Admitting: Family Medicine

## 2020-03-15 ENCOUNTER — Encounter: Payer: Self-pay | Admitting: Family Medicine

## 2020-03-16 MED ORDER — LINACLOTIDE 145 MCG PO CAPS: 145.0000 ug | ORAL_CAPSULE | Freq: Every day | ORAL | 0 refills | Status: DC

## 2020-03-16 MED ORDER — AEROCHAMBER PLUS MISC: 2 refills | Status: DC

## 2020-03-22 ENCOUNTER — Encounter: Payer: Self-pay | Admitting: Obstetrics and Gynecology

## 2020-03-22 ENCOUNTER — Other Ambulatory Visit: Payer: Self-pay

## 2020-03-22 ENCOUNTER — Ambulatory Visit (INDEPENDENT_AMBULATORY_CARE_PROVIDER_SITE_OTHER): Payer: 59 | Admitting: Obstetrics and Gynecology

## 2020-03-22 ENCOUNTER — Other Ambulatory Visit (HOSPITAL_COMMUNITY)
Admission: RE | Admit: 2020-03-22 | Discharge: 2020-03-22 | Disposition: A | Discharge status: Home / Self Care | Payer: Self-pay | Source: Ambulatory Visit | Attending: Obstetrics and Gynecology | Admitting: Obstetrics and Gynecology

## 2020-03-22 VITALS — BP 126/79 | HR 61 | Ht 66.0 in | Wt 170.6 lb

## 2020-03-22 DIAGNOSIS — Z87410 Personal history of cervical dysplasia: Secondary | ICD-10-CM | POA: Diagnosis not present

## 2020-03-22 DIAGNOSIS — N951 Menopausal and female climacteric states: Secondary | ICD-10-CM | POA: Diagnosis not present

## 2020-03-22 DIAGNOSIS — Z01419 Encounter for gynecological examination (general) (routine) without abnormal findings: Secondary | ICD-10-CM | POA: Diagnosis not present

## 2020-03-22 NOTE — Addendum Note (Signed)
Addended by: Durwin Glaze on: 03/22/2020 11:06 AM   Modules accepted: Orders

## 2020-03-22 NOTE — Progress Notes (Signed)
HPI:      [name] is a 54 y.o. G2P2 who LMP was No LMP recorded. Patient has had a hysterectomy.  Subjective:   She presents today for her annual examination.  Patient states that she had a hysterectomy approximately at age 55 which included removal of ovaries.  She states that she kept her cervix at that time.  She subsequently had cervical dysplasia and underwent conization in the OR. Patient continues to experience occasional hot flashes.  Estrogen is contraindicated for her because she has a brain cyst and was told by her not neurologist that estrogen could cause blood clots and possible stroke.    Hx: The following portions of the patient's history were reviewed and updated as appropriate:             She  has a past medical history of Allergy, Asthma, Hyperlipidemia, Migraine, and 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([place]). She does not have any pertinent problems on file. She  has a past surgical history that includes Rotator cuff repair; Appendectomy; Tonsillectomy and adenoidectomy (1972); Abdominal hysterectomy; Eye surgery; and Colonoscopy with propofol (N/A, 11/25/2017). Her family history includes Alcohol abuse in her mother; Cancer in her father and mother; Colon cancer in her paternal grandfather; Hypertension in her mother; Melanoma in her mother. She  reports that she has never smoked. She has never used smokeless tobacco. She reports current alcohol use. She reports that she does not use drugs. She has a current medication list which includes the following prescription(s): albuterol, aspirin ec, biotin, butalbital-acetaminophen-caffeine, epinephrine, fluticasone, hydrocodone-homatropine, linaclotide, montelukast, multivitamin, aerochamber plus, and zinc citrate-phytase. She is allergic to morphine, belladonna alkaloids, lexapro [escitalopram oxalate], and morphine and related.       Review of Systems:  Review of Systems  Constitutional: Denied constitutional symptoms, night sweats,  recent illness, fatigue, fever, insomnia and weight loss.  Eyes: Denied eye symptoms, eye pain, photophobia, vision change and visual disturbance.  Ears/Nose/Throat/Neck: Denied ear, nose, throat or neck symptoms, hearing loss, nasal discharge, sinus congestion and sore throat.  Cardiovascular: Denied cardiovascular symptoms, arrhythmia, chest pain/pressure, edema, exercise intolerance, orthopnea and palpitations.  Respiratory: Denied pulmonary symptoms, asthma, pleuritic pain, productive sputum, cough, dyspnea and wheezing.  Gastrointestinal: Denied, gastro-esophageal reflux, melena, nausea and vomiting.  Genitourinary: Denied genitourinary symptoms including symptomatic vaginal discharge, pelvic relaxation issues, and urinary complaints.  Musculoskeletal: Denied musculoskeletal symptoms, stiffness, swelling, muscle weakness and myalgia.  Dermatologic: Denied dermatology symptoms, rash and scar.  Neurologic: Denied neurology symptoms, dizziness, headache, neck pain and syncope.  Psychiatric: Denied psychiatric symptoms, anxiety and depression.  Endocrine: See HPI for additional information.   Meds:   Current Outpatient Medications on File Prior to Visit  Medication Sig Dispense Refill  . albuterol (VENTOLIN HFA) 108 (90 Base) MCG/ACT inhaler Inhale 2 puffs into the lungs every 6 (six) hours as needed for wheezing or shortness of breath. 8 g 0  . aspirin EC 81 MG tablet Take 81 mg by mouth daily. Swallow whole.    . Biotin 1000 MCG CHEW Chew 10,000 mg by mouth.     . butalbital-acetaminophen-caffeine (FIORICET, ESGIC) 50-325-40 MG tablet Take 1 tablet by mouth 2 (two) times daily as needed for headache (Every 6 hours as needed for headache). 20 tablet 1  . EPINEPHrine 0.3 mg/0.3 mL IJ SOAJ injection Inject 0.3 mLs (0.3 mg total) into the muscle as needed for anaphylaxis. 2 Device 0  . fluticasone (FLONASE) 50 MCG/ACT nasal spray Place 2 sprays into both nostrils daily. 16 g  6  .  HYDROcodone-homatropine (HYCODAN) 5-1.5 MG/5ML syrup Take 5 mLs by mouth every 8 (eight) hours as needed for cough. 120 mL 0  . linaclotide (LINZESS) 145 MCG CAPS capsule Take 1 capsule (145 mcg total) by mouth daily before breakfast. 30 capsule 0  . montelukast (SINGULAIR) 10 MG tablet Take 1 tablet (10 mg total) by mouth at bedtime. 90 tablet 2  . Multiple Vitamin (MULTIVITAMIN) tablet Take 1 tablet by mouth daily.    Marland Kitchen Spacer/Aero-Holding Chambers (AEROCHAMBER PLUS) inhaler Use as instructed 1 each 2  . Zinc Citrate-Phytase 25-500 MG CAPS Take by mouth.     No current facility-administered medications on file prior to visit.    Objective:     Vitals:   03/22/20 0910  BP: 126/79  Pulse: 61              Physical examination General NAD, Conversant  HEENT Atraumatic; Op clear with mmm.  Normo-cephalic. Pupils reactive. Anicteric sclerae  Thyroid/Neck Smooth without nodularity or enlargement. Normal ROM.  Neck Supple.  Skin No rashes, lesions or ulceration. Normal palpated skin turgor. No nodularity.  Breasts: No masses or discharge.  Symmetric.  No axillary adenopathy.  Lungs: Clear to auscultation.No rales or wheezes. Normal Respiratory effort, no retractions.  Heart: NSR.  No murmurs or rubs appreciated. No periferal edema  Abdomen: Soft.  Non-tender.  No masses.  No HSM. No hernia  Extremities: Moves all appropriately.  Normal ROM for age. No lymphadenopathy.  Neuro: Oriented to PPT.  Normal mood. Normal affect.     Pelvic:   Vulva: Normal appearance.  No lesions.  Vagina: No lesions or abnormalities noted.  Support: Normal pelvic support.  Urethra No masses tenderness or scarring.  Meatus Normal size without lesions or prolapse.  Cervix:  Appears by speculum examination as a vaginal cuff scar.  Palpates like a retained cervix.  Anus: Normal exam.  No lesions.  Perineum: Normal exam.  No lesions.        Bimanual   Uterus:  Surgically absent  Adnexae: No masses.   Non-tender to palpation.  Cul-de-sac: Negative for abnormality.      Assessment:    G2P2 Patient Active Problem List   Diagnosis Date Noted  . Migraine 09/08/2018  . Encounter for screening colonoscopy   . Polyp of sigmoid colon   . Chronic fatigue 05/21/2017  . Insomnia 05/21/2017  . Overweight 05/20/2017  . Restless leg syndrome 05/20/2017  . History of kidney stones 06/11/2016  . Anxiety 03/10/2015  . 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 CN (constipation) 03/10/2015  . Blood in the urine 03/10/2015  . Personal history of disorder of nervous system and sense organs 03/10/2015  . 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 Asthma 12/31/2014  . 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 TIA (transient ischemic attack) 12/31/2014  . Allergic rhinitis 12/31/2014  . Hyperlipidemia 12/31/2014  . Abnormal cervical Pap smear with positive HPV DNA test 12/31/2014     1. Well woman exam with routine gynecological exam   2. History of cervical dysplasia   3. Symptomatic menopausal or female climacteric states        Plan:            1.  Basic Screening Recommendations The basic screening recommendations for asymptomatic women were discussed with the patient during her visit.  The age-appropriate recommendations were discussed with her and the rational for the tests reviewed.  When I am informed by the patient that another primary care physician has  previously obtained the age-appropriate tests and they are up-to-date, only outstanding tests are ordered and referrals given as necessary.  Abnormal results of tests will be discussed with her when all of her results are completed.  Routine preventative health maintenance measures emphasized: Exercise/Diet/Weight control, Tobacco Warnings, Alcohol/Substance use risks and Stress Management Recommend Pap with HPV because of dysplasia history 2.  Recommend DEXA scan because patient has been menopausal since surgical menopause at age 66. 41.  Calcium  vitamin D and multivitamins recommended.  Weightbearing exercise. 4.  Patient scheduled for mammogram in October  Orders Orders Placed This Encounter  Procedures  . DG Bone Density    No orders of the defined types were placed in this encounter.           F/U  Return in about 1 year (around 03/22/2021) for Annual Physical.  Finis Bud, M.D. 03/22/2020 9:58 AM

## 2020-03-24 LAB — CYTOLOGY - PAP
Comment: NEGATIVE
Diagnosis: NEGATIVE
High risk HPV: NEGATIVE

## 2020-03-30 ENCOUNTER — Telehealth: Payer: Self-pay

## 2020-03-30 NOTE — Telephone Encounter (Signed)
Pt would like to schedule her bone density but there is no order.   Pls advise.

## 2020-03-30 NOTE — Telephone Encounter (Signed)
Dr. Amalia Hailey placed the order at her visit.

## 2020-03-31 NOTE — Telephone Encounter (Signed)
Notified patient that bone density order was placed and that she needs to call [facility] to schedule

## 2020-04-20 ENCOUNTER — Ambulatory Visit: Payer: Self-pay

## 2020-05-09 ENCOUNTER — Ambulatory Visit
Admission: RE | Admit: 2020-05-09 | Discharge: 2020-05-09 | Disposition: A | Discharge status: Home / Self Care | Payer: 59 | Source: Ambulatory Visit | Attending: Family Medicine | Admitting: Family Medicine

## 2020-05-09 ENCOUNTER — Ambulatory Visit
Admission: RE | Admit: 2020-05-09 | Discharge: 2020-05-09 | Disposition: A | Discharge status: Home / Self Care | Payer: 59 | Source: Ambulatory Visit | Attending: Obstetrics and Gynecology | Admitting: Obstetrics and Gynecology

## 2020-05-09 ENCOUNTER — Other Ambulatory Visit: Payer: Self-pay

## 2020-05-09 DIAGNOSIS — Z1231 Encounter for screening mammogram for malignant neoplasm of breast: Secondary | ICD-10-CM | POA: Diagnosis present

## 2020-05-09 DIAGNOSIS — N951 Menopausal and female climacteric states: Secondary | ICD-10-CM | POA: Insufficient documentation

## 2020-05-09 DIAGNOSIS — Z01419 Encounter for gynecological examination (general) (routine) without abnormal findings: Secondary | ICD-10-CM | POA: Insufficient documentation

## 2020-05-12 ENCOUNTER — Telehealth: Payer: Self-pay

## 2020-05-12 NOTE — Telephone Encounter (Signed)
-----   Message from Mar Daring, [place] sent at 05/12/2020 12:56 PM EST ----- Normal mammogram. Repeat screening in one year.

## 2020-05-12 NOTE — Telephone Encounter (Signed)
Written by Mar Daring, PA-C on 05/12/2020 12:56 PM EST Seen by patient Joanne Wade on 05/12/2020 1:20 PM

## 2020-05-17 ENCOUNTER — Ambulatory Visit: Payer: 59 | Admitting: Physician Assistant

## 2020-05-17 ENCOUNTER — Encounter: Payer: Self-pay | Admitting: Physician Assistant

## 2020-05-17 ENCOUNTER — Telehealth: Payer: Self-pay

## 2020-05-17 ENCOUNTER — Other Ambulatory Visit: Payer: Self-pay

## 2020-05-17 VITALS — BP 130/70 | HR 66 | Temp 98.4°F | Wt 173.3 lb

## 2020-05-17 DIAGNOSIS — R110 Nausea: Secondary | ICD-10-CM | POA: Diagnosis not present

## 2020-05-17 DIAGNOSIS — K5792 Diverticulitis of intestine, part unspecified, without perforation or abscess without bleeding: Secondary | ICD-10-CM | POA: Diagnosis not present

## 2020-05-17 DIAGNOSIS — R1032 Left lower quadrant pain: Secondary | ICD-10-CM

## 2020-05-17 MED ORDER — AMOXICILLIN-POT CLAVULANATE 875-125 MG PO TABS: 1.0000 | ORAL_TABLET | Freq: Three times a day (TID) | ORAL | 0 refills | Status: AC

## 2020-05-17 MED ORDER — ONDANSETRON HCL 4 MG PO TABS: 4.0000 mg | ORAL_TABLET | Freq: Three times a day (TID) | ORAL | 0 refills | Status: DC | PRN

## 2020-05-17 MED ORDER — TAMSULOSIN HCL 0.4 MG PO CAPS: 0.4000 mg | ORAL_CAPSULE | Freq: Every day | ORAL | 3 refills | Status: DC

## 2020-05-17 NOTE — Telephone Encounter (Signed)
[facility] pharmacy calling to verify Augmentin instructions. Read what is documented in chart.

## 2020-05-17 NOTE — Progress Notes (Signed)
Established patient visit   Patient: Joanne Wade   DOB: Apr 03, 1966   54 y.o. Female  MRN: [id number] Visit Date: 05/17/2020  Today's healthcare provider: Trinna Post, PA-C   Chief Complaint  Patient presents with  . Back Pain  I,Porsha C McClurkin,acting as a scribe for Trinna Post, PA-C.,have documented all relevant documentation on the behalf of Trinna Post, PA-C,as directed by  Trinna Post, PA-C while in the presence of Trinna Post, PA-C.  Subjective    Back Pain This is a new problem. The current episode started 1 to 4 weeks ago. The problem occurs intermittently. The problem is unchanged. The quality of the pain is described as aching. The pain is at a severity of 5/10. The pain is mild. The pain is the same all the time. The symptoms are aggravated by bending, position, standing, sitting and twisting. Associated symptoms include abdominal pain. Pertinent negatives include no bladder incontinence, bowel incontinence, headaches, numbness, pelvic pain, tingling or weakness.    She reports left lower quadrant pain that has been going on intermittently for 2 weeks but then yesterday became more severe. She reports breathing in deep causes pain to get worse. No diarrhea. Reports last bowel movement was a couple a days ago. She had a hysterectomy and bilateral oophrectomy. History of diverticulosis on colonoscopy 2019. She has a history of kidney stones several years ago which ultimately did pass.        Medications: Outpatient Medications Prior to Visit  Medication Sig  . albuterol (VENTOLIN HFA) 108 (90 Base) MCG/ACT inhaler Inhale 2 puffs into the lungs every 6 (six) hours as needed for wheezing or shortness of breath.  Marland Kitchen aspirin EC 81 MG tablet Take 81 mg by mouth daily. Swallow whole.  . Biotin 1000 MCG CHEW Chew 10,000 mg by mouth.   . butalbital-acetaminophen-caffeine (FIORICET, ESGIC) 50-325-40 MG tablet Take 1 tablet by mouth 2 (two) times  daily as needed for headache (Every 6 hours as needed for headache).  . Carboxymeth-Cellulose-CitricAc (PLENITY) CAPS Take by mouth.  . EPINEPHrine 0.3 mg/0.3 mL IJ SOAJ injection Inject 0.3 mLs (0.3 mg total) into the muscle as needed for anaphylaxis.  . fluticasone (FLONASE) 50 MCG/ACT nasal spray Place 2 sprays into both nostrils daily.  Marland Kitchen HYDROcodone-homatropine (HYCODAN) 5-1.5 MG/5ML syrup Take 5 mLs by mouth every 8 (eight) hours as needed for cough.  . linaclotide (LINZESS) 145 MCG CAPS capsule Take 1 capsule (145 mcg total) by mouth daily before breakfast.  . montelukast (SINGULAIR) 10 MG tablet Take 1 tablet (10 mg total) by mouth at bedtime.  . Multiple Vitamin (MULTIVITAMIN) tablet Take 1 tablet by mouth daily.  Marland Kitchen Spacer/Aero-Holding Chambers (AEROCHAMBER PLUS) inhaler Use as instructed  . Zinc Citrate-Phytase 25-500 MG CAPS Take by mouth.   No facility-administered medications prior to visit.    Review of Systems  Constitutional: Negative.   Respiratory: Negative.   Gastrointestinal: Positive for abdominal pain. Negative for bowel incontinence.  Genitourinary: Negative for bladder incontinence and pelvic pain.  Musculoskeletal: Positive for back pain.  Neurological: Negative for tingling, weakness, numbness and headaches.      Objective    BP 130/70 (BP Location: Left Arm, Patient Position: Sitting, Cuff Size: Large)   Pulse 66   Temp 98.4 F (36.9 C) (Oral)   Wt 173 lb 4.8 oz (78.6 kg)   SpO2 100%   BMI 27.97 kg/m    Physical Exam Constitutional:  Appearance: Normal appearance. She is obese.  Cardiovascular:     Rate and Rhythm: Normal rate and regular rhythm.     Heart sounds: Normal heart sounds.  Pulmonary:     Effort: Pulmonary effort is normal.     Breath sounds: Normal breath sounds.  Abdominal:     General: Bowel sounds are normal.     Palpations: Abdomen is soft.     Tenderness: There is abdominal tenderness in the left lower quadrant.   Skin:    General: Skin is warm and dry.  Neurological:     General: No focal deficit present.     Mental Status: She is alert and oriented to person, place, and time. Mental status is at baseline.  Psychiatric:        Mood and Affect: Mood normal.        Behavior: Behavior normal.       No results found for any visits on 05/17/20.  Assessment & Plan    1. Diverticulitis  - amoxicillin-clavulanate (AUGMENTIN) 875-125 MG tablet; Take 1 tablet by mouth in the morning, at noon, and at bedtime for 7 days.  Dispense: 21 tablet; Refill: 0 - tamsulosin (FLOMAX) 0.4 MG CAPS capsule; Take 1 capsule (0.4 mg total) by mouth daily.  Dispense: 30 capsule; Refill: 3  2. Nausea  - ondansetron (ZOFRAN) 4 MG tablet; Take 1 tablet (4 mg total) by mouth every 8 (eight) hours as needed for nausea or vomiting.  Dispense: 20 tablet; Refill: 0    No follow-ups on file.      ITrinna Post, PA-C, have reviewed all documentation for this visit. The documentation on 05/17/20 for the exam, diagnosis, procedures, and orders are all accurate and complete.  The entirety of the information documented in the History of Present Illness, Review of Systems and Physical Exam were personally obtained by me. Portions of this information were initially documented by [facility] and reviewed by me for thoroughness and accuracy.     Paulene Floor  [facility] [phone] (phone) [phone] (fax)  [place]

## 2020-05-17 NOTE — Patient Instructions (Signed)

## 2020-05-31 NOTE — Progress Notes (Signed)
Established patient visit   Patient: Joanne Wade   DOB: Jan 29, 1966   54 y.o. Female  MRN: [id number] Visit Date: 06/01/2020  Today's healthcare provider: Lavon Paganini, MD   Chief Complaint  Patient presents with  . Hair/Scalp Problem    She brought some hair that fell out after one brush  . Knee Pain    Burning knee pain  . Fatigue    Lethargy since her hip procedure   Subjective    HPI HPI    Hair/Scalp Problem     Additional comments: She brought some hair that fell out after one brush          Knee Pain     Additional comments: Burning knee pain          Fatigue     Additional comments: Lethargy since her hip procedure       Last edited by Sylvester Harder, CMA on 06/01/2020 11:19 AM. (History)      Pt has been have general body pain, hair loss, lethargy. Pt wants a full work up.  Hair loss for close to a year. Ponytail feels thinner. Using Rogaine.  PRP of the hip, now worsening pain again with knee hurting more now too. A lot of joint stiffening  Worsening of SOB and symptoms after COVID in 01/2020  Patient Active Problem List   Diagnosis Date Noted  . Migraine 09/08/2018  . Encounter for screening colonoscopy   . Polyp of sigmoid colon   . Chronic fatigue 05/21/2017  . Insomnia 05/21/2017  . Overweight 05/20/2017  . Restless leg syndrome 05/20/2017  . History of kidney stones 06/11/2016  . Anxiety 03/10/2015  . 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 CN (constipation) 03/10/2015  . Blood in the urine 03/10/2015  . Personal history of disorder of nervous system and sense organs 03/10/2015  . 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 Asthma 12/31/2014  . 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 TIA (transient ischemic attack) 12/31/2014  . Allergic rhinitis 12/31/2014  . Hyperlipidemia 12/31/2014  . Abnormal cervical Pap smear with positive HPV DNA test 12/31/2014   Social History   Tobacco Use  . Smoking status: Never Smoker  .  Smokeless tobacco: Never Used  Vaping Use  . Vaping Use: Never used  Substance Use Topics  . Alcohol use: Yes    Comment: ocassional  . Drug use: No   Allergies  Allergen Reactions  . Morphine Other (See Comments)    Other Reaction: Other reaction  . Belladonna Alkaloids   . Lexapro [Escitalopram Oxalate]   . Morphine And Related Itching  . Tramadol        Medications: Outpatient Medications Prior to Visit  Medication Sig  . albuterol (VENTOLIN HFA) 108 (90 Base) MCG/ACT inhaler Inhale 2 puffs into the lungs every 6 (six) hours as needed for wheezing or shortness of breath.  Marland Kitchen aspirin EC 81 MG tablet Take 81 mg by mouth daily. Swallow whole.  . Biotin 1000 MCG CHEW Chew 10,000 mg by mouth.   . butalbital-acetaminophen-caffeine (FIORICET, ESGIC) 50-325-40 MG tablet Take 1 tablet by mouth 2 (two) times daily as needed for headache (Every 6 hours as needed for headache).  . Carboxymeth-Cellulose-CitricAc (PLENITY) CAPS Take by mouth.  . EPINEPHrine 0.3 mg/0.3 mL IJ SOAJ injection Inject 0.3 mLs (0.3 mg total) into the muscle as needed for anaphylaxis.  . fluticasone (FLONASE) 50 MCG/ACT nasal spray Place 2 sprays into both nostrils daily.  Marland Kitchen HYDROcodone-homatropine (HYCODAN) 5-1.5 MG/5ML  syrup Take 5 mLs by mouth every 8 (eight) hours as needed for cough.  . montelukast (SINGULAIR) 10 MG tablet Take 1 tablet (10 mg total) by mouth at bedtime.  . Multiple Vitamin (MULTIVITAMIN) tablet Take 1 tablet by mouth daily.  . ondansetron (ZOFRAN) 4 MG tablet Take 1 tablet (4 mg total) by mouth every 8 (eight) hours as needed for nausea or vomiting.  Marland Kitchen Spacer/Aero-Holding Chambers (AEROCHAMBER PLUS) inhaler Use as instructed  . tamsulosin (FLOMAX) 0.4 MG CAPS capsule Take 1 capsule (0.4 mg total) by mouth daily.  . Zinc Citrate-Phytase 25-500 MG CAPS Take by mouth.  . linaclotide (LINZESS) 145 MCG CAPS capsule Take 1 capsule (145 mcg total) by mouth daily before breakfast. (Patient not taking:  Reported on 06/01/2020)   No facility-administered medications prior to visit.    Review of Systems  HENT: Negative.   Respiratory: Positive for shortness of breath. Negative for cough.   Cardiovascular: Negative.     Last CBC Lab Results  Component Value Date   WBC 5.6 05/20/2017   HGB 13.1 05/20/2017   HCT 40.1 05/20/2017   MCV 85.3 05/20/2017   MCH 27.9 05/20/2017   RDW 12.1 05/20/2017   PLT 263 05/20/2017      Objective    BP 135/90 (BP Location: Left Arm, Patient Position: Sitting, Cuff Size: Large)   Pulse 62   Temp 98.2 F (36.8 C) (Oral)   Resp 16   Ht 5\' 6"  (1.676 m)   Wt 169 lb 14.4 oz (77.1 kg)   SpO2 98%   BMI 27.42 kg/m  BP Readings from Last 3 Encounters:  06/01/20 135/90  05/17/20 130/70  03/22/20 126/79   Wt Readings from Last 3 Encounters:  06/01/20 169 lb 14.4 oz (77.1 kg)  05/17/20 173 lb 4.8 oz (78.6 kg)  03/22/20 170 lb 9.6 oz (77.4 kg)      Physical Exam Vitals reviewed.  Constitutional:      General: She is not in acute distress.    Appearance: Normal appearance. She is well-developed. She is not diaphoretic.  HENT:     Head: Normocephalic and atraumatic.  Eyes:     General: No scleral icterus.    Conjunctiva/sclera: Conjunctivae normal.  Neck:     Thyroid: No thyromegaly.  Cardiovascular:     Rate and Rhythm: Normal rate and regular rhythm.     Pulses: Normal pulses.     Heart sounds: Normal heart sounds. No murmur heard.   Pulmonary:     Effort: Pulmonary effort is normal. No respiratory distress.     Breath sounds: Normal breath sounds. No wheezing, rhonchi or rales.  Musculoskeletal:     Cervical back: Neck supple.     Right lower leg: No edema.     Left lower leg: No edema.  Lymphadenopathy:     Cervical: No cervical adenopathy.  Skin:    General: Skin is warm and dry.     Findings: No rash.     Comments: No discrete patches of hair loss  Neurological:     Mental Status: She is alert and oriented to person,  place, and time. Mental status is at baseline.  Psychiatric:        Mood and Affect: Mood normal.        Behavior: Behavior normal.       No results found for any visits on 06/01/20.  Assessment & Plan     1. Chronic fatigue 2. Hair loss -Ongoing and worsening since  having Covid in 01/2020 -Discussed telogen effluvium -Can continue Rogaine -Hold biotin and other supplements and check CMP, TSH, CBC, vitamin D, B12 early next week -Discussed that fatigue is nonspecific and multifactorial, but labs will be helpful in determining possible diagnosis -Encourage exercise and sleep schedule -Further work-up and management pending lab results - Comprehensive metabolic panel - TSH - CBC - VITAMIN D 25 Hydroxy (Vit-D Deficiency, Fractures) - B12  3. Arthralgia, unspecified joint -Followed by orthopedics with recent PRP injections -Encouraged her to follow-up with them -Encouraged staying active with gentle exercise  4. DOE (dyspnea on exertion) -Reports feeling difficulty getting as deep of breath as she did prior to having Covid in 01/2020 -This is especially evident with exertion -Discussed possible long Covid syndrome and that it can take months to regain baseline respiratory status -Trial of Stiolto for 2 weeks to see if this improves things -No chest pain or other suggestion of cardiac cause -Consider PFTs or pulmonary referral  5. Overweight -Discussed with patient that she is not a candidate for bariatric surgery -Discussed importance of healthy weight management Discussed diet and exercise    Return if symptoms worsen or fail to improve.      I, Lavon Paganini, MD, have reviewed all documentation for this visit. The documentation on 06/02/20 for the exam, diagnosis, procedures, and orders are all accurate and complete.   Dmetrius Ambs, Dionne Bucy, MD, MPH [facility]

## 2020-06-01 ENCOUNTER — Encounter: Payer: Self-pay | Admitting: Family Medicine

## 2020-06-01 ENCOUNTER — Other Ambulatory Visit: Payer: Self-pay

## 2020-06-01 ENCOUNTER — Ambulatory Visit: Payer: 59 | Admitting: Family Medicine

## 2020-06-01 VITALS — BP 135/90 | HR 62 | Temp 98.2°F | Resp 16 | Ht 66.0 in | Wt 169.9 lb

## 2020-06-01 DIAGNOSIS — R0609 Other forms of dyspnea: Secondary | ICD-10-CM

## 2020-06-01 DIAGNOSIS — R5382 Chronic fatigue, unspecified: Secondary | ICD-10-CM

## 2020-06-01 DIAGNOSIS — M2550 Pain in unspecified joint: Secondary | ICD-10-CM | POA: Diagnosis not present

## 2020-06-01 DIAGNOSIS — L659 Nonscarring hair loss, unspecified: Secondary | ICD-10-CM | POA: Diagnosis not present

## 2020-06-01 DIAGNOSIS — E663 Overweight: Secondary | ICD-10-CM

## 2020-06-01 DIAGNOSIS — R0600 Dyspnea, unspecified: Secondary | ICD-10-CM | POA: Diagnosis not present

## 2020-06-01 NOTE — Patient Instructions (Signed)

## 2020-06-02 NOTE — Telephone Encounter (Signed)
I'm ok with testing COVID antibodies (IgG). Can you order this and leave it up front?

## 2020-06-05 ENCOUNTER — Other Ambulatory Visit: Payer: Self-pay

## 2020-06-05 DIAGNOSIS — Z008 Encounter for other general examination: Secondary | ICD-10-CM

## 2020-06-06 ENCOUNTER — Encounter: Payer: Self-pay | Admitting: Family Medicine

## 2020-06-06 LAB — COMPREHENSIVE METABOLIC PANEL
ALT: 20 IU/L (ref 0–32)
AST: 20 IU/L (ref 0–40)
Albumin/Globulin Ratio: 1.8 (ref 1.2–2.2)
Albumin: 4.5 g/dL (ref 3.8–4.9)
Alkaline Phosphatase: 78 IU/L (ref 44–121)
BUN/Creatinine Ratio: 12 (ref 9–23)
BUN: 12 mg/dL (ref 6–24)
Bilirubin Total: 0.3 mg/dL (ref 0.0–1.2)
CO2: 22 mmol/L (ref 20–29)
Calcium: 9.2 mg/dL (ref 8.7–10.2)
Chloride: 107 mmol/L — ABNORMAL HIGH (ref 96–106)
Creatinine, Ser: 1.04 mg/dL — ABNORMAL HIGH (ref 0.57–1.00)
GFR calc Af Amer: 71 mL/min/{1.73_m2} (ref >59)
GFR calc non Af Amer: 61 mL/min/{1.73_m2} (ref >59)
Globulin, Total: 2.5 g/dL (ref 1.5–4.5)
Glucose: 102 mg/dL — ABNORMAL HIGH (ref 65–99)
Potassium: 4.2 mmol/L (ref 3.5–5.2)
Sodium: 143 mmol/L (ref 134–144)
Total Protein: 7 g/dL (ref 6.0–8.5)

## 2020-06-06 LAB — CBC
Hematocrit: 41.8 % (ref 34.0–46.6)
Hemoglobin: 13.9 g/dL (ref 11.1–15.9)
MCH: 28.4 pg (ref 26.6–33.0)
MCHC: 33.3 g/dL (ref 31.5–35.7)
MCV: 85 fL (ref 79–97)
Platelets: 276 10*3/uL (ref 150–450)
RBC: 4.9 x10E6/uL (ref 3.77–5.28)
RDW: 12.7 % (ref 11.7–15.4)
WBC: 6.5 10*3/uL (ref 3.4–10.8)

## 2020-06-06 LAB — VITAMIN B12: Vitamin B-12: 659 pg/mL (ref 232–1245)

## 2020-06-06 LAB — VITAMIN D 25 HYDROXY (VIT D DEFICIENCY, FRACTURES): Vit D, 25-Hydroxy: 41.9 ng/mL (ref 30.0–100.0)

## 2020-06-06 LAB — TSH: TSH: 2.09 u[IU]/mL (ref 0.450–4.500)

## 2020-06-06 LAB — SARS-COV-2 ANTIBODY, IGM: SARS-CoV-2 Antibody, IgM: NEGATIVE

## 2020-06-06 NOTE — Progress Notes (Signed)
I think this maybe should have been an IgG, not IgM. IgM is for acute infection, IgG is for history of and looking for antibodies for immunity that we are often asked about. Can we have the lab run the IgG instead?

## 2020-06-09 LAB — SAR COV2 SEROLOGY (COVID19)AB(IGG),IA
SARS-CoV-2 Semi-Quant IgG Ab: 213 AU/mL (ref <13.0)
SARS-CoV-2 Spike Ab Interp: POSITIVE

## 2020-06-09 LAB — SPECIMEN STATUS REPORT

## 2020-06-24 DIAGNOSIS — Z9889 Other specified postprocedural states: Secondary | ICD-10-CM

## 2020-06-24 HISTORY — DX: Other specified postprocedural states: Z98.890

## 2020-07-30 ENCOUNTER — Encounter (INDEPENDENT_AMBULATORY_CARE_PROVIDER_SITE_OTHER): Payer: Self-pay

## 2020-11-01 ENCOUNTER — Ambulatory Visit
Admission: RE | Admit: 2020-11-01 | Discharge: 2020-11-01 | Disposition: A | Discharge status: Home / Self Care | Payer: 59 | Source: Ambulatory Visit | Attending: Nurse Practitioner | Admitting: Nurse Practitioner

## 2020-11-01 ENCOUNTER — Encounter: Payer: Self-pay | Admitting: Nurse Practitioner

## 2020-11-01 ENCOUNTER — Telehealth (INDEPENDENT_AMBULATORY_CARE_PROVIDER_SITE_OTHER): Payer: 59 | Admitting: Nurse Practitioner

## 2020-11-01 ENCOUNTER — Other Ambulatory Visit: Payer: Self-pay

## 2020-11-01 ENCOUNTER — Ambulatory Visit
Admission: RE | Admit: 2020-11-01 | Discharge: 2020-11-01 | Disposition: A | Discharge status: Home / Self Care | Payer: 59 | Attending: Nurse Practitioner | Admitting: Nurse Practitioner

## 2020-11-01 VITALS — BP 130/80 | HR 97 | Temp 102.3°F

## 2020-11-01 DIAGNOSIS — R059 Cough, unspecified: Secondary | ICD-10-CM

## 2020-11-01 DIAGNOSIS — R509 Fever, unspecified: Secondary | ICD-10-CM

## 2020-11-01 MED ORDER — GUAIFENESIN-CODEINE 100-10 MG/5ML PO SOLN: 5.0000 mL | Freq: Two times a day (BID) | ORAL | 0 refills | Status: DC | PRN

## 2020-11-01 MED ORDER — METHYLPREDNISOLONE 4 MG PO TBPK: ORAL_TABLET | ORAL | 0 refills | Status: DC

## 2020-11-01 NOTE — Progress Notes (Signed)
BP 130/80   Pulse 97   Temp (!) 102.3 F (39.1 C)    Subjective:    Patient ID: Joanne Wade, female    DOB: Oct 27, 1965, 55 y.o.   MRN: [id number]  HPI: Joanne Wade is a 55 y.o. female  Chief Complaint  Patient presents with  . Cough    Fever, runny/congested nose, full ears, has been feeling horrible for the last 3 days. Patient has taken 3 COVID tests all negative. Body hurts head hurts   UPPER RESPIRATORY TRACT INFECTION Worst symptom: Patient states she has been sick for about 10 days.  She has been worse over the last 3 days. Has taken 3 COVID tests which were all negative. Last one was this morning.  Fever: yes 102.3 Cough: yes Shortness of breath: no Wheezing: no Chest pain: yes, with cough Chest tightness: yes Chest congestion: no Nasal congestion: yes Runny nose: no Post nasal drip: no Sneezing: yes Sore throat: yes Swollen glands: no Sinus pressure: yes Headache: yes Face pain: no Toothache: no Ear pain: yes bilateral Ear pressure: yes bilateral Eyes red/itching:no Eye drainage/crusting: no  Vomiting: no Rash: no Fatigue: yes Sick contacts: yes Strep contacts: no  Context: worse Recurrent sinusitis: no Relief with OTC cold/cough medications: yes  Treatments attempted: cough syrup   Relevant past medical, surgical, family and social history reviewed and updated as indicated. Interim medical history since our last visit reviewed. Allergies and medications reviewed and updated.  Review of Systems  Constitutional: Positive for fatigue and fever.       Body aches  HENT: Positive for congestion, ear pain, sneezing and sore throat. Negative for dental problem, postnasal drip, rhinorrhea, sinus pressure and sinus pain.   Respiratory: Positive for cough. Negative for shortness of breath and wheezing.   Cardiovascular: Negative for chest pain.  Gastrointestinal: Negative for vomiting.  Skin: Negative for rash.  Neurological: Positive for  headaches.    Per HPI unless specifically indicated above     Objective:    BP 130/80   Pulse 97   Temp (!) 102.3 F (39.1 C)   Wt Readings from Last 3 Encounters:  06/01/20 169 lb 14.4 oz (77.1 kg)  05/17/20 173 lb 4.8 oz (78.6 kg)  03/22/20 170 lb 9.6 oz (77.4 kg)    Physical Exam Vitals and nursing note reviewed.  Constitutional:      General: She is not in acute distress.    Appearance: She is not ill-appearing.  HENT:     Head: Normocephalic.     Right Ear: Hearing normal.     Left Ear: Hearing normal.     Nose: Nose normal.  Pulmonary:     Effort: Pulmonary effort is normal. No respiratory distress.  Neurological:     Mental Status: She is alert.  Psychiatric:        Mood and Affect: Mood normal.        Behavior: Behavior normal.        Thought Content: Thought content normal.        Judgment: Judgment normal.     Results for orders placed or performed in visit on 06/05/20  SARS-CoV-2 Antibody, IgM  Result Value Ref Range   SARS-CoV-2 Antibody, IgM Negative Negative  SAR CoV2 Serology (COVID 19)AB(IGG)IA  Result Value Ref Range   SARS-CoV-2 Semi-Quant IgG Ab 213.0 Neg <13.0 AU/mL   SARS-CoV-2 Spike Ab Interp Positive   Specimen status report  Result Value Ref Range   specimen status  report Comment       Assessment & Plan:   Problem List Items Addressed This Visit   None   Visit Diagnoses    Cough    -  Primary   Complete course of steroids. Sent Cough medicine. Xray and Flu test ordered. Will make recommendations based on results.   Relevant Orders   DG Chest 2 View   Influenza a and b   Fever, unspecified fever cause       Relevant Orders   DG Chest 2 View   Influenza a and b       Follow up plan: Return if symptoms worsen or fail to improve.   This visit was completed via MyChart due to the restrictions of the COVID-19 pandemic. All issues as above were discussed and addressed. Physical exam was done as above through visual  confirmation on MyChart. If it was felt that the patient should be evaluated in the office, they were directed there. The patient verbally consented to this visit. 1. Location of the patient: Home 2. Location of the provider: Office 3. Those involved with this call:  ? Provider: Jon Billings, NP ? CMA: Tiffany Reel, CMA ? Front Desk/Registration: [facility] 4. Time spent on call: 15 minutes with patient face to face via video conference. More than 50% of this time was spent in counseling and coordination of care. 20 minutes total spent in review of patient's record and preparation of their chart.

## 2020-11-02 ENCOUNTER — Telehealth: Payer: Self-pay | Admitting: Nurse Practitioner

## 2020-11-02 ENCOUNTER — Telehealth: Payer: Self-pay

## 2020-11-02 LAB — VERITOR FLU A/B WAIVED
Influenza A: POSITIVE — AB
Influenza B: NEGATIVE

## 2020-11-02 NOTE — Telephone Encounter (Signed)
Pt called in and was given her result from Jon Billings that she is positive for Flu A.  She had a chest x ray done yesterday and has not heard from [place] regarding the results.  (This pt normally goes to [facility] but was referred to [facility] for a televisit with Jon Billings).   Please have someone call her regarding the chest x ray result.    I did not see a reading in Epic.

## 2020-11-02 NOTE — Addendum Note (Signed)
Addended by: Jon Billings on: 11/02/2020 11:34 AM   Modules accepted: Orders

## 2020-11-02 NOTE — Telephone Encounter (Signed)
See other phone encounter in chart for today.

## 2020-11-02 NOTE — Telephone Encounter (Signed)
Called and LVM asking for patient to please return my call. OK for PEC to give result message if patient calls back.

## 2020-11-02 NOTE — Progress Notes (Signed)
Hi Joanne Wade.  Your Flu test came back positive for Flu A.  I am still waiting for your chest xray results.

## 2020-11-02 NOTE — Telephone Encounter (Signed)
Copied from [place] [id number]. Topic: Quick Communication - Other Results (Clinic Use ONLY) >> Nov 02, 2020 10:58 AM Lennox Solders wrote: Pt is calling and would like flu result and chest xray result. Pt went to [place] office

## 2020-11-02 NOTE — Telephone Encounter (Signed)
Can we please call patient and let her know she is positive for Flu A.  Since this is a virus antibiotics will not be helpful.  At this point, it is symptom management.  Please let me know if patient has further questions.

## 2020-11-03 NOTE — Progress Notes (Signed)
Hi Joanne Wade.  Your chest xray just came back.  It is normal.  Continue with recommendations as discussed.  It can take a about 2 weeks for symptoms to subsided.

## 2021-01-05 ENCOUNTER — Ambulatory Visit: Payer: Self-pay | Admitting: *Deleted

## 2021-01-05 NOTE — Telephone Encounter (Signed)
Reason for Disposition  [9] Systolic BP  >= 728 OR Diastolic >= 80 AND [2] not taking BP medications  Answer Assessment - Initial Assessment Questions 1. BLOOD PRESSURE: "What is the blood pressure?" "Did you take at least two measurements 5 minutes apart?"     158/83 at home . Patient not at home at this time. 2. ONSET: "When did you take your blood pressure?"     This am 3. HOW: "How did you obtain the blood pressure?" (e.g., visiting nurse, automatic home BP monitor)     Automatic b/p  4. HISTORY: "Do you have a history of high blood pressure?"     na 5. MEDICATIONS: "Are you taking any medications for blood pressure?" "Have you missed any doses recently?"     na 6. OTHER SYMPTOMS: "Do you have any symptoms?" (e.g., headache, chest pain, blurred vision, difficulty breathing, weakness)     Headache, blurred vision feet tingling, sharp pain here and there in chest  7. PREGNANCY: "Is there any chance you are pregnant?" "When was your last menstrual period?"     na  Protocols used: Blood Pressure - High-A-AH

## 2021-01-05 NOTE — Telephone Encounter (Signed)
C/o elevated B/P . Patient reports she has noticed she has not been feeling well for a couple of weeks. C/o lightheaded at times or like she is in a fog, some blurred vision at times , headache and feeling tired at times. Bilateral feet tingling now, denies swelling in legs or feet, denies tingling in hands . Heat makes her feel more tired. Denies chest pain but reports sharp pains here and there in chest. No difficulty breathing but reports shallow breathing at times. Reports B/P check this am 158/83. Patient reports she is not at home now and unable to recheck B/P. Instructed patient to keep a log of B/P readings for PCP. Reports she drinks plenty of water. Patient would like to get appt prior to Wednesday because she is going on vacation. Denies any issues in the past with high blood pressure. Denies sore throat, sinus pain, cough. Denies weakness on with side of body or face. If any available appt today patient requesting to be contacted. Appt scheduled for 03/08/21. Care advise given . Patient verbalized understanding of care advise and to call back or go to [facility] or ED if symptoms worsen.

## 2021-01-08 ENCOUNTER — Other Ambulatory Visit: Payer: Self-pay

## 2021-01-08 ENCOUNTER — Encounter: Payer: Self-pay | Admitting: Family Medicine

## 2021-01-08 ENCOUNTER — Ambulatory Visit (INDEPENDENT_AMBULATORY_CARE_PROVIDER_SITE_OTHER): Payer: 59 | Admitting: Family Medicine

## 2021-01-08 VITALS — BP 138/81 | HR 64 | Resp 16 | Wt 169.2 lb

## 2021-01-08 DIAGNOSIS — R200 Anesthesia of skin: Secondary | ICD-10-CM

## 2021-01-08 DIAGNOSIS — R079 Chest pain, unspecified: Secondary | ICD-10-CM

## 2021-01-08 DIAGNOSIS — R030 Elevated blood-pressure reading, without diagnosis of hypertension: Secondary | ICD-10-CM

## 2021-01-08 DIAGNOSIS — R0600 Dyspnea, unspecified: Secondary | ICD-10-CM | POA: Diagnosis not present

## 2021-01-08 DIAGNOSIS — R202 Paresthesia of skin: Secondary | ICD-10-CM

## 2021-01-08 NOTE — Telephone Encounter (Signed)
Ok to give any available appt (see my schedule for visits labelled ok for 20 for possible slots)

## 2021-01-08 NOTE — Progress Notes (Signed)
Established patient visit   Patient: Joanne Wade   DOB: 21-Dec-1965   55 y.o. Female  MRN: [id number] Visit Date: 01/08/2021  Today's healthcare provider: Lelon Huh, MD   Chief Complaint  Patient presents with   Hypertension    Readings at home 158/82, this morning 137/92 within 45min blood pressure went down to 123/81. Blood pressure readings at home [id number] and diastolic 40-08Q   Subjective    Hypertension  HPI     Hypertension           Chronicity: new problem   Onset: 1 to 4 weeks ago   Agents associated with hypertension: NSAIDs   Anxiety: Present   Blurred vision: Present   Chest pain: Absent   Chest pressure/discomfort: Present (Patient reports intermittent, but states that she notices more when laying down )   Dyspnea: Present   Headaches: Present   Lower extremity edema: Absent   Orthopnea : Absent   Palpitations: Absent   Paroxysmal nocturnal dyspnea : Absent   Syncope: Absent   Exercise: never exercises   Diet: generally healthy   Comments: Readings at home 158/82, this morning 137/92 within 28min blood pressure went down to 123/81. Blood pressure readings at home [id number] and diastolic 32-67T       Last edited by Minette Headland, CMA on 01/08/2021  2:23 PM.      States BP is usually normal at rest and increases significantly when she gets up and around, during which time she feels unusually fatigued and feels numb in both hands and both feet. She does feel a little more short of breath than usual when exerting herself. No orthopnea.   Medications: Outpatient Medications Prior to Visit  Medication Sig   aspirin EC 81 MG tablet Take 81 mg by mouth daily. Swallow whole.   butalbital-acetaminophen-caffeine (FIORICET, ESGIC) 50-325-40 MG tablet Take 1 tablet by mouth 2 (two) times daily as needed for headache (Every 6 hours as needed for headache).   fluticasone (FLONASE) 50 MCG/ACT nasal spray Place 2 sprays into both nostrils daily.    montelukast (SINGULAIR) 10 MG tablet Take 1 tablet (10 mg total) by mouth at bedtime.   [DISCONTINUED] EPINEPHrine 0.3 mg/0.3 mL IJ SOAJ injection Inject 0.3 mLs (0.3 mg total) into the muscle as needed for anaphylaxis.   [DISCONTINUED] guaiFENesin-codeine 100-10 MG/5ML syrup Take 5 mLs by mouth 2 (two) times daily as needed for cough.   [DISCONTINUED] methylPREDNISolone (MEDROL DOSEPAK) 4 MG TBPK tablet Take as directed.   No facility-administered medications prior to visit.    Review of Systems  Neurological:  Positive for numbness.       Objective    BP 138/81   Pulse 64   Resp 16   Wt 169 lb 3.2 oz (76.7 kg)   SpO2 100%   BMI 27.31 kg/m      Physical Exam   General: Appearance:     Overweight female in no acute distress  Eyes:    PERRL, conjunctiva/corneas clear, EOM's intact       Lungs:     Clear to auscultation bilaterally, respirations unlabored  Heart:    Normal heart rate. Normal rhythm. No murmurs, rubs, or gallops.    MS:   All extremities are intact.    Neurologic:   Awake, alert, oriented x 3. No apparent focal neurological defect.     Assessment & Plan     1. Elevated blood pressure reading Consider starting ACEI or CCB  if labs are normal.   2. Chest pain, unspecified type Not typical for cardiac angina.  - EKG 12-Lead  3. Dyspnea, unspecified type  - CBC with Differential/Platelet  4. Numbness and tingling  - G64 - Basic metabolic panel - VITAMIN D 25 Hydroxy (Vit-D Deficiency, Fractures) - TSH     The entirety of the information documented in the History of Present Illness, Review of Systems and Physical Exam were personally obtained by me. Portions of this information were initially documented by the CMA and reviewed by me for thoroughness and accuracy.     Lelon Huh, MD  [facility] [phone] (phone) [phone] (fax)  [place]

## 2021-01-09 ENCOUNTER — Encounter: Payer: Self-pay | Admitting: Family Medicine

## 2021-01-09 ENCOUNTER — Other Ambulatory Visit: Payer: Self-pay | Admitting: Family Medicine

## 2021-01-09 DIAGNOSIS — R030 Elevated blood-pressure reading, without diagnosis of hypertension: Secondary | ICD-10-CM

## 2021-01-09 LAB — CBC WITH DIFFERENTIAL/PLATELET
Basophils Absolute: 0.1 10*3/uL (ref 0.0–0.2)
Basos: 1 %
EOS (ABSOLUTE): 0.1 10*3/uL (ref 0.0–0.4)
Eos: 2 %
Hematocrit: 43.8 % (ref 34.0–46.6)
Hemoglobin: 14.5 g/dL (ref 11.1–15.9)
Immature Grans (Abs): 0 10*3/uL (ref 0.0–0.1)
Immature Granulocytes: 0 %
Lymphocytes Absolute: 2.7 10*3/uL (ref 0.7–3.1)
Lymphs: 39 %
MCH: 28.4 pg (ref 26.6–33.0)
MCHC: 33.1 g/dL (ref 31.5–35.7)
MCV: 86 fL (ref 79–97)
Monocytes Absolute: 0.5 10*3/uL (ref 0.1–0.9)
Monocytes: 8 %
Neutrophils Absolute: 3.5 10*3/uL (ref 1.4–7.0)
Neutrophils: 50 %
Platelets: 281 10*3/uL (ref 150–450)
RBC: 5.11 x10E6/uL (ref 3.77–5.28)
RDW: 13.1 % (ref 11.7–15.4)
WBC: 6.9 10*3/uL (ref 3.4–10.8)

## 2021-01-09 LAB — BASIC METABOLIC PANEL
BUN/Creatinine Ratio: 17 (ref 9–23)
BUN: 17 mg/dL (ref 6–24)
CO2: 24 mmol/L (ref 20–29)
Calcium: 9.8 mg/dL (ref 8.7–10.2)
Chloride: 104 mmol/L (ref 96–106)
Creatinine, Ser: 0.99 mg/dL (ref 0.57–1.00)
Glucose: 89 mg/dL (ref 65–99)
Potassium: 4.2 mmol/L (ref 3.5–5.2)
Sodium: 141 mmol/L (ref 134–144)
eGFR: 68 mL/min/{1.73_m2} (ref >59)

## 2021-01-09 LAB — TSH: TSH: 1.53 u[IU]/mL (ref 0.450–4.500)

## 2021-01-09 LAB — VITAMIN D 25 HYDROXY (VIT D DEFICIENCY, FRACTURES): Vit D, 25-Hydroxy: 39.4 ng/mL (ref 30.0–100.0)

## 2021-01-09 LAB — VITAMIN B12: Vitamin B-12: 598 pg/mL (ref 232–1245)

## 2021-01-09 MED ORDER — VALSARTAN 80 MG PO TABS
80.0000 mg | ORAL_TABLET | Freq: Every day | ORAL | 1 refills | Status: DC
Start: 2021-01-09 — End: 2021-03-08

## 2021-01-09 NOTE — Telephone Encounter (Signed)
Error, no note intended

## 2021-02-01 ENCOUNTER — Telehealth: Payer: Self-pay | Admitting: Family Medicine

## 2021-02-01 DIAGNOSIS — R030 Elevated blood-pressure reading, without diagnosis of hypertension: Secondary | ICD-10-CM

## 2021-02-01 NOTE — Telephone Encounter (Signed)
Please check with patient to make sure she is doing OK with valsartan for blood pressure prescribed in July. If doing well then need to stop by lab to check renal panel to make sure it is not causing any trouble with potassium or electrolyte levels.

## 2021-02-02 NOTE — Telephone Encounter (Signed)
Patient reports that she is only taking 1/4 tablet of the valsartan '80mg'$ . She reports that her BP was too low while taking a full tablet. Her BP was averaging in the 90s/60s with 1 tablet. She took 1/2 tablet and BP average was 110s/70s.  On 1/4 tablet, she reports BP readings are 120s/70s and she feels better.   Patient reports that she will stop by next week to have labs drawn.

## 2021-02-23 LAB — RENAL FUNCTION PANEL
Albumin: 4.5 g/dL (ref 3.8–4.9)
BUN/Creatinine Ratio: 15 (ref 9–23)
BUN: 16 mg/dL (ref 6–24)
CO2: 22 mmol/L (ref 20–29)
Calcium: 9 mg/dL (ref 8.7–10.2)
Chloride: 104 mmol/L (ref 96–106)
Creatinine, Ser: 1.06 mg/dL — ABNORMAL HIGH (ref 0.57–1.00)
Glucose: 122 mg/dL — ABNORMAL HIGH (ref 65–99)
Phosphorus: 4.1 mg/dL (ref 3.0–4.3)
Potassium: 4.2 mmol/L (ref 3.5–5.2)
Sodium: 141 mmol/L (ref 134–144)
eGFR: 62 mL/min/{1.73_m2} (ref >59)

## 2021-03-08 ENCOUNTER — Ambulatory Visit (INDEPENDENT_AMBULATORY_CARE_PROVIDER_SITE_OTHER): Payer: 59 | Admitting: Family Medicine

## 2021-03-08 ENCOUNTER — Other Ambulatory Visit: Payer: Self-pay

## 2021-03-08 ENCOUNTER — Encounter: Payer: Self-pay | Admitting: Family Medicine

## 2021-03-08 VITALS — BP 112/76 | HR 63 | Temp 98.2°F | Resp 16 | Ht 66.0 in | Wt 168.0 lb

## 2021-03-08 DIAGNOSIS — Z23 Encounter for immunization: Secondary | ICD-10-CM | POA: Diagnosis not present

## 2021-03-08 DIAGNOSIS — R030 Elevated blood-pressure reading, without diagnosis of hypertension: Secondary | ICD-10-CM | POA: Diagnosis not present

## 2021-03-08 DIAGNOSIS — E663 Overweight: Secondary | ICD-10-CM

## 2021-03-08 DIAGNOSIS — H539 Unspecified visual disturbance: Secondary | ICD-10-CM | POA: Insufficient documentation

## 2021-03-08 NOTE — Assessment & Plan Note (Signed)
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

## 2021-03-08 NOTE — Assessment & Plan Note (Signed)
Discussed that she does not meet criteria for HTN diagnosis Her BP is low normal on 20 mg of Valsartan Discussed that this is unlikely to be helping BP much Discussed stopping, monitoring home BPs, and continuing lifestyle interventions Recheck in 2-3 months Reviewed recent labs

## 2021-03-08 NOTE — Progress Notes (Signed)
Established patient visit   Patient: Joanne Wade   DOB: 27-Sep-1965   55 y.o. Female  MRN: [id number] Visit Date: 03/08/2021  Today's healthcare provider: Lavon Paganini, MD   Chief Complaint  Patient presents with   Follow-up    Subjective    HPI  Follow up for elevated blood pressure  The patient was last seen for this 2 months ago. Changes made at last visit include start Diovan '80mg'$  daily. Patient reports taking 1/4 tablet daily. Patient reports BP 115-130/70-80's.  She was prescribed valsartan and she reports the half tablet dropped her blood pressure below [id number] systolic.  So she started taking the quarter dose of the pill.  For about 2 months her vision has changed to the point she can't read a text message.   She reports fair compliance with treatment. She feels that condition is Improved. She is having side effects. 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 Patient reports that last MRI was done about  5 years ago. She believes this also could be a reason for her spike in blood pressue and vision changes.  Visual disturbances She seen her eye doctor 10 days ago because of blurred vision, pus and crust around her eyes. She was prescribed eye drops that have not been helpful.   Flu Vaccine - 03/08/21 Shingrix - 1st dose 01/16/21  -----------------------------------------------------------------------------------------     Medications: Outpatient Medications Prior to Visit  Medication Sig   aspirin EC 81 MG tablet Take 81 mg by mouth daily. Swallow whole.   butalbital-acetaminophen-caffeine (FIORICET, ESGIC) 50-325-40 MG tablet Take 1 tablet by mouth 2 (two) times daily as needed for headache (Every 6 hours as needed for headache).   fluticasone (FLONASE) 50 MCG/ACT nasal spray Place 2 sprays into both nostrils daily.   montelukast (SINGULAIR) 10 MG tablet Take 1 tablet (10 mg total) by mouth at bedtime.   [DISCONTINUED]  valsartan (DIOVAN) 80 MG tablet Take 1 tablet (80 mg total) by mouth daily.   No facility-administered medications prior to visit.    Review of Systems  Constitutional:  Negative for activity change, appetite change, chills, fatigue and fever.  HENT:  Negative for ear pain, sinus pressure, sinus pain and sore throat.   Eyes:  Positive for visual disturbance. Negative for pain.  Respiratory:  Negative for cough, chest tightness, shortness of breath and wheezing.   Cardiovascular:  Negative for chest pain, palpitations and leg swelling.  Gastrointestinal:  Negative for abdominal pain, diarrhea, nausea and vomiting.  Genitourinary:  Negative for dysuria, flank pain, frequency, pelvic pain and urgency.  Musculoskeletal:  Negative for back pain, myalgias and neck pain.  Neurological:  Negative for dizziness, weakness, light-headedness, numbness and headaches.   Last metabolic panel Lab Results  Component Value Date   GLUCOSE 122 (H) 02/22/2021   NA 141 02/22/2021   K 4.2 02/22/2021   CL 104 02/22/2021   CO2 22 02/22/2021   BUN 16 02/22/2021   CREATININE 1.06 (H) 02/22/2021   GFRNONAA 61 06/05/2020   GFRAA 71 06/05/2020   CALCIUM 9.0 02/22/2021   PHOS 4.1 02/22/2021   PROT 7.0 06/05/2020   ALBUMIN 4.5 02/22/2021   LABGLOB 2.5 06/05/2020   AGRATIO 1.8 06/05/2020   BILITOT 0.3 06/05/2020   ALKPHOS 78 06/05/2020   AST 20 06/05/2020   ALT 20 06/05/2020   ANIONGAP 6 12/12/2014       Objective    BP 112/76 (BP Location:  Left Arm, Patient Position: Sitting, Cuff Size: Large)   Pulse 63   Temp 98.2 F (36.8 C) (Oral)   Resp 16   Ht '5\' 6"'$  (1.676 m)   Wt 168 lb (76.2 kg)   SpO2 99%   BMI 27.12 kg/m  BP Readings from Last 3 Encounters:  03/08/21 112/76  01/08/21 138/81  11/01/20 130/80   Wt Readings from Last 3 Encounters:  03/08/21 168 lb (76.2 kg)  01/08/21 169 lb 3.2 oz (76.7 kg)  06/01/20 169 lb 14.4 oz (77.1 kg)      Physical Exam Vitals reviewed.   Constitutional:      General: She is not in acute distress.    Appearance: Normal appearance. She is well-developed. She is not diaphoretic.  HENT:     Head: Normocephalic and atraumatic.  Eyes:     General: No scleral icterus.    Conjunctiva/sclera: Conjunctivae normal.  Neck:     Thyroid: No thyromegaly.  Cardiovascular:     Rate and Rhythm: Normal rate and regular rhythm.     Pulses: Normal pulses.     Heart sounds: Normal heart sounds. No murmur heard. Pulmonary:     Effort: Pulmonary effort is normal. No respiratory distress.     Breath sounds: Normal breath sounds. No wheezing, rhonchi or rales.  Musculoskeletal:     Cervical back: Neck supple.     Right lower leg: No edema.     Left lower leg: No edema.  Lymphadenopathy:     Cervical: No cervical adenopathy.  Skin:    General: Skin is warm and dry.     Findings: No rash.  Neurological:     Mental Status: She is alert and oriented to person, place, and time. Mental status is at baseline.  Psychiatric:        Mood and Affect: Mood normal.        Behavior: Behavior normal.      No results found for any visits on 03/08/21.  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      I,Essence Turner,acting as a Education administrator for Lavon Paganini, MD.,have documented all relevant documentation on the behalf of Lavon Paganini, MD,as directed by  Lavon Paganini, MD while in the presence of Lavon Paganini, MD.  I, Lavon Paganini, MD, have reviewed all documentation for this visit. The documentation on 03/08/21 for the exam, diagnosis, procedures, and orders are  all accurate and complete.   Chandan Fly, Dionne Bucy, MD, MPH [facility]

## 2021-03-08 NOTE — Patient Instructions (Signed)
[email or web address]

## 2021-03-08 NOTE — Assessment & Plan Note (Signed)
Discussed importance of healthy weight management Discussed diet and exercise  

## 2021-04-17 ENCOUNTER — Encounter: Payer: Self-pay | Admitting: Family Medicine

## 2021-04-20 ENCOUNTER — Other Ambulatory Visit: Payer: Self-pay | Admitting: Family Medicine

## 2021-04-21 NOTE — Telephone Encounter (Signed)
Requested Prescriptions  Pending Prescriptions Disp Refills  . montelukast (SINGULAIR) 10 MG tablet [Pharmacy Med Name: MONTELUKAST SODIUM 10 MG TAB] 90 tablet 0    Sig: TAKE ONE TABLET BY MOUTH AT BEDTIME     Pulmonology:  Leukotriene Inhibitors Passed - 04/20/2021 11:28 AM      Passed - Valid encounter within last 12 months    Recent Outpatient Visits          1 month ago Elevated blood pressure reading   [facility] [place], Dionne Bucy, MD   3 months ago Elevated blood pressure reading   [facility] Birdie Sons, MD   5 months ago Cough   [place], NP   10 months ago Chronic fatigue   [facility] [place], Dionne Bucy, MD   11 months ago Diverticulitis   [facility] Trinna Post, [place]      Future Appointments            In 2 weeks Bacigalupo, Dionne Bucy, MD [facility], [place]   In 4 months Bacigalupo, Dionne Bucy, MD [facility], [place]

## 2021-05-07 ENCOUNTER — Ambulatory Visit (INDEPENDENT_AMBULATORY_CARE_PROVIDER_SITE_OTHER): Payer: 59 | Admitting: Family Medicine

## 2021-05-07 ENCOUNTER — Other Ambulatory Visit: Payer: Self-pay

## 2021-05-07 ENCOUNTER — Encounter: Payer: Self-pay | Admitting: Family Medicine

## 2021-05-07 VITALS — BP 116/80 | HR 61 | Temp 97.2°F | Resp 16 | Ht 66.0 in | Wt 171.0 lb

## 2021-05-07 DIAGNOSIS — R030 Elevated blood-pressure reading, without diagnosis of hypertension: Secondary | ICD-10-CM | POA: Diagnosis not present

## 2021-05-07 DIAGNOSIS — E663 Overweight: Secondary | ICD-10-CM

## 2021-05-07 MED ORDER — NALTREXONE-BUPROPION HCL ER 8-90 MG PO TB12: ORAL_TABLET | ORAL | 0 refills | Status: DC

## 2021-05-07 NOTE — Progress Notes (Signed)
Established patient visit   Patient: Joanne Wade   DOB: Sep 24, 1965   55 y.o. Female  MRN: [id number] Visit Date: 05/07/2021  Today's healthcare provider: Lavon Paganini, MD   Chief Complaint  Patient presents with   Follow-up    Blood pressure    Subjective    HPI HPI     Follow-up    Additional comments: Blood pressure      Last edited by Virginia Crews, MD on 05/07/2021  5:02 PM.      Follow up for elevated blood pressure  The patient was last seen for this 2 months ago. Changes made at last visit include D/C Valsartan  She reports excellent compliance with treatment. She feels that condition is Improved. She is not having side effects.   -----------------------------------------------------------------------------------------  Follow up for obesity  The patient was last seen for this 2 months ago. Changes made at last visit include advised healthy lifestyle changes.  She reports good compliance with treatment. She feels that condition is Unchanged. Patient reports she did intermittent fasting for about 3 weeks. Patient reports she lost about two pounds. Patient reports intermittent fasting is not for her. She is not having side effects.   Patient reports walking about 25-30 minutes 3 days a week.   Wt Readings from Last 3 Encounters:  05/07/21 171 lb (77.6 kg)  03/08/21 168 lb (76.2 kg)  01/08/21 169 lb 3.2 oz (76.7 kg)    -----------------------------------------------------------------------------------------    Medications: Outpatient Medications Prior to Visit  Medication Sig   butalbital-acetaminophen-caffeine (FIORICET, ESGIC) 50-325-40 MG tablet Take 1 tablet by mouth 2 (two) times daily as needed for headache (Every 6 hours as needed for headache).   fluticasone (FLONASE) 50 MCG/ACT nasal spray Place 2 sprays into both nostrils daily.   montelukast (SINGULAIR) 10 MG tablet TAKE ONE TABLET BY MOUTH AT BEDTIME    [DISCONTINUED] aspirin EC 81 MG tablet Take 81 mg by mouth daily. Swallow whole. (Patient not taking: Reported on 05/07/2021)   No facility-administered medications prior to visit.    Review of Systems  Constitutional:  Negative for activity change, appetite change and fatigue.  Respiratory:  Negative for chest tightness and shortness of breath.   Cardiovascular:  Negative for chest pain and palpitations.      Objective    BP 116/80 (BP Location: Left Arm, Patient Position: Sitting, Cuff Size: Large)   Pulse 61   Temp (!) 97.2 F (36.2 C) (Temporal)   Resp 16   Ht 5\' 6"  (1.676 m)   Wt 171 lb (77.6 kg)   SpO2 99%   BMI 27.60 kg/m  {Show previous vital signs (optional):23777}  Physical Exam Vitals reviewed.  Constitutional:      General: She is not in acute distress.    Appearance: Normal appearance. She is well-developed. She is not diaphoretic.  HENT:     Head: Normocephalic and atraumatic.  Eyes:     General: No scleral icterus.    Conjunctiva/sclera: Conjunctivae normal.  Neck:     Thyroid: No thyromegaly.  Cardiovascular:     Rate and Rhythm: Normal rate and regular rhythm.     Pulses: Normal pulses.     Heart sounds: Normal heart sounds. No murmur heard. Pulmonary:     Effort: Pulmonary effort is normal. No respiratory distress.     Breath sounds: Normal breath sounds. No wheezing, rhonchi or rales.  Musculoskeletal:     Cervical back: Neck supple.  Right lower leg: No edema.     Left lower leg: No edema.  Lymphadenopathy:     Cervical: No cervical adenopathy.  Skin:    General: Skin is warm and dry.     Findings: No rash.  Neurological:     Mental Status: She is alert and oriented to person, place, and time. Mental status is at baseline.  Psychiatric:        Mood and Affect: Mood normal.        Behavior: Behavior normal.      No results found for any visits on 05/07/21.  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      I, Lavon Paganini, MD, have reviewed all documentation for this visit. The documentation on 05/07/21 for the exam, diagnosis, procedures, and orders are all accurate and complete.   Rhandi Despain, Dionne Bucy, MD, MPH [facility]

## 2021-05-07 NOTE — Assessment & Plan Note (Signed)
Discussed importance of healthy weight management Discussed diet and exercise  Discussed weight loss med options Had HTN 2/2 phentermine Discussed that insurance will likely not cover Ozempic Discussed Contrave - will try

## 2021-05-07 NOTE — Assessment & Plan Note (Signed)
Improved since stopping phentermine Not on meds now

## 2021-05-07 NOTE — Patient Instructions (Signed)
Contrave savings card

## 2021-05-18 ENCOUNTER — Ambulatory Visit: Payer: Self-pay

## 2021-05-18 NOTE — Telephone Encounter (Signed)
Call to patient- patient was seen at [facility] and treated for UTI.

## 2021-05-18 NOTE — Telephone Encounter (Signed)
Patient called, left VM to return the call to the office to discuss symptoms with a nurse.  Summary: UTI symptoms   Pt states she has burning and frequent urination / possible UTI/ pt is taking Azo / please advise

## 2021-05-21 NOTE — Telephone Encounter (Signed)
Noted  

## 2021-05-29 ENCOUNTER — Encounter: Payer: Self-pay | Admitting: Family Medicine

## 2021-05-31 ENCOUNTER — Other Ambulatory Visit: Payer: Self-pay

## 2021-05-31 DIAGNOSIS — R110 Nausea: Secondary | ICD-10-CM

## 2021-05-31 MED ORDER — ONDANSETRON HCL 4 MG PO TABS: 4.0000 mg | ORAL_TABLET | Freq: Three times a day (TID) | ORAL | 1 refills | Status: DC | PRN

## 2021-05-31 NOTE — Telephone Encounter (Signed)
Please send in Zofran 4mg  PO tid prn #20 [id number] and let patient know it's been sent. Thanks

## 2021-06-27 ENCOUNTER — Other Ambulatory Visit: Payer: Self-pay | Admitting: Family Medicine

## 2021-06-27 DIAGNOSIS — Z1231 Encounter for screening mammogram for malignant neoplasm of breast: Secondary | ICD-10-CM

## 2021-07-03 ENCOUNTER — Other Ambulatory Visit: Payer: Self-pay | Admitting: Family Medicine

## 2021-07-03 NOTE — Telephone Encounter (Signed)
Requested Prescriptions  Pending Prescriptions Disp Refills   montelukast (SINGULAIR) 10 MG tablet [Pharmacy Med Name: MONTELUKAST SODIUM 10 MG TAB] 90 tablet 2    Sig: TAKE ONE TABLET BY MOUTH AT BEDTIME     Pulmonology:  Leukotriene Inhibitors Passed - 07/03/2021  3:36 PM      Passed - Valid encounter within last 12 months    Recent Outpatient Visits          1 month ago Elevated blood pressure reading   [facility], Dionne Bucy, MD   3 months ago Elevated blood pressure reading   [facility] [place], Dionne Bucy, MD   5 months ago Elevated blood pressure reading   [facility] Birdie Sons, MD   8 months ago Cough   [facility] [place] [facility] Jon Bill[place], NP   1 year ago Chronic fatigue   [facility] [place], Dionne Bucy, MD      Future Appointments            In 1 month Bacigalupo, Dionne Bucy, MD [facility], [place]

## 2021-08-28 ENCOUNTER — Ambulatory Visit (INDEPENDENT_AMBULATORY_CARE_PROVIDER_SITE_OTHER): Payer: 59 | Admitting: Family Medicine

## 2021-08-28 ENCOUNTER — Inpatient Hospital Stay: Payer: Self-pay

## 2021-08-28 ENCOUNTER — Encounter: Payer: Self-pay | Admitting: Family Medicine

## 2021-08-28 ENCOUNTER — Other Ambulatory Visit: Payer: Self-pay

## 2021-08-28 VITALS — BP 107/69 | HR 65 | Temp 98.7°F | Resp 16 | Wt 168.7 lb

## 2021-08-28 DIAGNOSIS — E785 Hyperlipidemia, unspecified: Secondary | ICD-10-CM

## 2021-08-28 DIAGNOSIS — Z0000 Encounter for general adult medical examination without abnormal findings: Secondary | ICD-10-CM | POA: Diagnosis not present

## 2021-08-28 DIAGNOSIS — E663 Overweight: Secondary | ICD-10-CM

## 2021-08-28 DIAGNOSIS — R002 Palpitations: Secondary | ICD-10-CM | POA: Diagnosis not present

## 2021-08-28 DIAGNOSIS — R739 Hyperglycemia, unspecified: Secondary | ICD-10-CM

## 2021-08-28 MED ORDER — MONTELUKAST SODIUM 10 MG PO TABS: 10.0000 mg | ORAL_TABLET | Freq: Every day | ORAL | 3 refills | Status: DC

## 2021-08-28 MED ORDER — CEPHALEXIN 500 MG PO CAPS: 500.0000 mg | ORAL_CAPSULE | Freq: Two times a day (BID) | ORAL | 0 refills | Status: AC

## 2021-08-28 NOTE — Assessment & Plan Note (Signed)
Reviewed last lipid panel Not currently on a statin Recheck FLP and CMP Discussed diet and exercise  

## 2021-08-28 NOTE — Assessment & Plan Note (Signed)
Longstanding, intermittent issue ?In NSR today ?Check labs for any underlying cause - CMP, CBC, TSH ?Zio patch x14 d ?Further eval/management pending Zio patch results ?

## 2021-08-28 NOTE — Progress Notes (Signed)
I,Sulibeya S Dimas,acting as a Neurosurgeon for Shirlee Latch, MD.,have documented all relevant documentation on the behalf of Shirlee Latch, MD,as directed by  Shirlee Latch, MD while in the presence of Shirlee Latch, MD.   Complete physical exam   Patient: Joanne Wade   DOB: 08-Feb-1966   56 y.o. Female  MRN: [id number] Visit Date: 08/28/2021  Today's healthcare provider: Shirlee Latch, MD   Chief Complaint  Patient presents with   Annual Exam   Subjective    Joanne Wade is a 56 y.o. female who presents today for a complete physical exam.  She reports consuming a  general  diet. Home exercise routine includes walking 3 hrs per week. She generally feels well. She reports sleeping well. She does not have additional problems to discuss today.  HPI   Contrave gave GI upset, so she only takes prn, not consistently. On Nutrisystem.  Palpitations when laying flat at night. Occurs frequently.   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[facility])    Past Surgical History:  Procedure Laterality Date   ABDOMINAL HYSTERECTOMY     still has cervix   APPENDECTOMY     COLONOSCOPY WITH PROPOFOL N/A 11/25/2017   Procedure: COLONOSCOPY WITH PROPOFOL;  Surgeon: Midge Minium, MD;  Location: [facility] ENDOSCOPY;  Service: Endoscopy;  Laterality: N/A;   EYE SURGERY Bilateral    03/25/2017 and 04/15/2017 "new lenses put in my eyes"   ROTATOR CUFF REPAIR     TONSILLECTOMY AND ADENOIDECTOMY  1972   Social History   Socioeconomic History   Marital status: Married    Spouse name: Not on file   Number of children: 2   Years of education: Not on file   Highest education level: Not on file  Occupational History   Not on file  Tobacco Use   Smoking status: Never   Smokeless tobacco: Never  Vaping Use   Vaping Use:  Never used  Substance and Sexual Activity   Alcohol use: Yes    Comment: ocassional   Drug use: No   Sexual activity: Yes    Birth control/protection: Surgical  Other Topics Concern   Not on file  Social History Narrative   Not on file   Social Determinants of Health   Financial Resource Strain: Not on file  Food Insecurity: Not on file  Transportation Needs: Not on file  Physical Activity: Not on file  Stress: Not on file  Social Connections: Not on file  Intimate Partner Violence: Not on file   Family Status  Relation Name Status   Mother  Deceased at age 81   Father  Alive   PGF  Deceased   Neg Hx  (Not Specified)   Family History  Problem Relation Age of Onset   Alcohol abuse Mother    Hypertension Mother    Melanoma Mother    Cancer Mother        brain and melanoma   Cancer Father        lung   Colon cancer Paternal Grandfather    Breast cancer Neg Hx    Allergies  Allergen Reactions   Morphine Other (See Comments)    Other Reaction: Other reaction   Belladonna Alkaloids    Lexapro [Escitalopram Oxalate]    Morphine  And Related Itching   Tramadol     Patient Care Team: Virginia Crews, MD as PCP - General (Family Medicine)   Medications: Outpatient Medications Prior to Visit  Medication Sig   butalbital-acetaminophen-caffeine (FIORICET, ESGIC) 50-325-40 MG tablet Take 1 tablet by mouth 2 (two) times daily as needed for headache (Every 6 hours as needed for headache).   fluticasone (FLONASE) 50 MCG/ACT nasal spray Place 2 sprays into both nostrils daily.   Naltrexone-buPROPion HCl ER 8-90 MG TB12 Start 1 tablet every morning for 7 days, then 1 tablet twice daily for 7 days, then 2 tablets every morning and one in the evening   ondansetron (ZOFRAN) 4 MG tablet Take 1 tablet (4 mg total) by mouth 3 (three) times daily as needed for nausea or vomiting.   [DISCONTINUED] montelukast (SINGULAIR) 10 MG tablet TAKE ONE TABLET BY MOUTH AT BEDTIME   No  facility-administered medications prior to visit.    Review of Systems  Genitourinary:  Positive for frequency.  Musculoskeletal:  Positive for arthralgias.  Allergic/Immunologic: Positive for environmental allergies and food allergies.  All other systems reviewed and are negative.  Last CBC Lab Results  Component Value Date   WBC 6.9 01/08/2021   HGB 14.5 01/08/2021   HCT 43.8 01/08/2021   MCV 86 01/08/2021   MCH 28.4 01/08/2021   RDW 13.1 01/08/2021   PLT 281 64/33/2951   Last metabolic panel Lab Results  Component Value Date   GLUCOSE 122 (H) 02/22/2021   NA 141 02/22/2021   K 4.2 02/22/2021   CL 104 02/22/2021   CO2 22 02/22/2021   BUN 16 02/22/2021   CREATININE 1.06 (H) 02/22/2021   EGFR 62 02/22/2021   CALCIUM 9.0 02/22/2021   PHOS 4.1 02/22/2021   PROT 7.0 06/05/2020   ALBUMIN 4.5 02/22/2021   LABGLOB 2.5 06/05/2020   AGRATIO 1.8 06/05/2020   BILITOT 0.3 06/05/2020   ALKPHOS 78 06/05/2020   AST 20 06/05/2020   ALT 20 06/05/2020   ANIONGAP 6 12/12/2014   Last lipids Lab Results  Component Value Date   CHOL 231 (H) 02/09/2019   HDL 63 02/09/2019   LDLCALC 151 (H) 02/09/2019   TRIG 87 02/09/2019   CHOLHDL 3.7 02/09/2019   Last hemoglobin A1c Lab Results  Component Value Date   HGBA1C 5.5 02/09/2019   Last thyroid functions Lab Results  Component Value Date   TSH 1.530 01/08/2021   T4TOTAL 7.6 09/29/2015      Objective    BP 107/69 (BP Location: Left Arm, Patient Position: Sitting, Cuff Size: Large)    Pulse 65    Temp 98.7 F (37.1 C) (Temporal)    Resp 16    Wt 168 lb 11.2 oz (76.5 kg)    BMI 27.23 kg/m  BP Readings from Last 3 Encounters:  08/28/21 107/69  05/07/21 116/80  03/08/21 112/76   Wt Readings from Last 3 Encounters:  08/28/21 168 lb 11.2 oz (76.5 kg)  05/07/21 171 lb (77.6 kg)  03/08/21 168 lb (76.2 kg)    Physical Exam Vitals reviewed.  Constitutional:      General: She is not in acute distress.    Appearance: Normal  appearance. She is well-developed. She is not diaphoretic.  HENT:     Head: Normocephalic and atraumatic.     Right Ear: Tympanic membrane, ear canal and external ear normal.     Left Ear: Tympanic membrane, ear canal and external ear normal.     Nose: Nose  normal.     Mouth/Throat:     Mouth: Mucous membranes are moist.     Pharynx: Oropharynx is clear. No oropharyngeal exudate.  Eyes:     General: No scleral icterus.    Conjunctiva/sclera: Conjunctivae normal.     Pupils: Pupils are equal, round, and reactive to light.  Neck:     Thyroid: No thyromegaly.  Cardiovascular:     Rate and Rhythm: Normal rate and regular rhythm.     Pulses: Normal pulses.     Heart sounds: Normal heart sounds. No murmur heard. Pulmonary:     Effort: Pulmonary effort is normal. No respiratory distress.     Breath sounds: Normal breath sounds. No wheezing or rales.  Abdominal:     General: There is no distension.     Palpations: Abdomen is soft.     Tenderness: There is no abdominal tenderness.  Musculoskeletal:        General: No deformity.     Cervical back: Neck supple.     Right lower leg: No edema.     Left lower leg: No edema.  Lymphadenopathy:     Cervical: No cervical adenopathy.  Skin:    General: Skin is warm and dry.     Findings: No rash.  Neurological:     Mental Status: She is alert and oriented to person, place, and time. Mental status is at baseline.     Sensory: No sensory deficit.     Motor: No weakness.     Gait: Gait normal.  Psychiatric:        Mood and Affect: Mood normal.        Behavior: Behavior normal.        Thought Content: Thought content normal.      Last depression screening scores PHQ 2/9 Scores 08/28/2021 05/07/2021 03/08/2021  PHQ - 2 Score 0 0 0  PHQ- 9 Score $Remov'1 2 2   'rCjSpV$ Last fall risk screening Fall Risk  08/28/2021  Falls in the past year? 0  Number falls in past yr: 0  Injury with Fall? 0  Risk for fall due to : No Fall Risks  Follow up Falls  evaluation completed   Last Audit-C alcohol use screening Alcohol Use Disorder Test (AUDIT) 08/28/2021  1. How often do you have a drink containing alcohol? 1  2. How many drinks containing alcohol do you have on a typical day when you are drinking? 0  3. How often do you have six or more drinks on one occasion? 0  AUDIT-C Score 1   A score of 3 or more in women, and 4 or more in men indicates increased risk for alcohol abuse, EXCEPT if all of the points are from question 1   No results found for any visits on 08/28/21.  Assessment & Plan    Routine Health Maintenance and Physical Exam  Exercise Activities and Dietary recommendations  Goals      Exercise 150 minutes per week (moderate activity)        Immunization History  Administered Date(s) Administered   Influenza, Quadrivalent, Recombinant, Inj, Pf 03/30/2018, 03/29/2019   Influenza,inj,Quad PF,6+ Mos 05/23/2015, 07/25/2016, 03/08/2021   Influenza,inj,quad, With Preservative 04/18/2017   Influenza-Unspecified 03/28/2019, 05/08/2020   Tdap 10/15/2017   Zoster Recombinat (Shingrix) 01/16/2021, 04/16/2021    Health Maintenance  Topic Date Due   HIV Screening  Never done   MAMMOGRAM  05/09/2022   PAP SMEAR-Modifier  03/23/2023   TETANUS/TDAP  10/16/2027   COLONOSCOPY (  Pts 45-14yrs Insurance coverage will need to be confirmed)  11/26/2027   INFLUENZA VACCINE  Completed   Hepatitis C Screening  Completed   Zoster Vaccines- Shingrix  Completed   HPV VACCINES  Aged Out    Discussed health benefits of physical activity, and encouraged her to engage in regular exercise appropriate for her age and condition.  Problem List Items Addressed This Visit       Other   Hyperlipidemia    Reviewed last lipid panel Not currently on a statin Recheck FLP and CMP Discussed diet and exercise       Relevant Orders   Comprehensive metabolic panel   Lipid panel   Overweight    Discussed importance of healthy weight  management Discussed diet and exercise       Relevant Orders   Comprehensive metabolic panel   Lipid panel   CBC   TSH   Hemoglobin A1c   Palpitations    Longstanding, intermittent issue In NSR today Check labs for any underlying cause - CMP, CBC, TSH Zio patch x14 d Further eval/management pending Zio patch results      Relevant Orders   LONG TERM MONITOR (3-14 DAYS)   Comprehensive metabolic panel   CBC   TSH   Other Visit Diagnoses     Encounter for annual physical exam    -  Primary   Relevant Orders   Comprehensive metabolic panel   Lipid panel   CBC   TSH   Hemoglobin A1c   Hyperglycemia       Relevant Orders   Hemoglobin A1c        Return in about 1 year (around 08/29/2022) for CPE.     I, Lavon Paganini, MD, have reviewed all documentation for this visit. The documentation on 08/28/21 for the exam, diagnosis, procedures, and orders are all accurate and complete.   Marnee Sherrard, Dionne Bucy, MD, MPH [facility]

## 2021-08-28 NOTE — Assessment & Plan Note (Signed)
Discussed importance of healthy weight management Discussed diet and exercise  

## 2021-09-03 ENCOUNTER — Telehealth: Payer: Self-pay

## 2021-09-03 NOTE — Telephone Encounter (Signed)
Patient advised to call insurance for price. ?

## 2021-09-03 NOTE — Telephone Encounter (Signed)
Copied from [place] [phone]. Topic: General - Inquiry ?>> Sep 03, 2021  3:51 PM Oneta Rack wrote: ?Requesting procedure code for zox heart monitor and please call patient directly ?

## 2021-09-03 NOTE — Telephone Encounter (Signed)
Called.

## 2021-09-03 NOTE — Telephone Encounter (Signed)
Please advise 

## 2021-09-03 NOTE — Telephone Encounter (Signed)
Zio patch ordered for Palpitations ICD10 R00.2.  The easiest way to confirm insurance coverage is to call the number provided with the monitor for the [facility]. They are able to discuss insurance coverage and responsibility for the patient

## 2021-09-03 NOTE — Telephone Encounter (Signed)
Copied from [place] [id number]. Topic: General - Inquiry ?>> Sep 03, 2021 11:05 AM Scherrie Gerlach wrote: ?Reason for CRM: Joanne Wade w/l Lorayne Bender calling to get procedure code for the heart monitor.  Pt wanted to know if it would be covered by her insurance. ?Please call the pt w/ procedure code for heart monitor ?

## 2021-09-04 ENCOUNTER — Ambulatory Visit
Admission: RE | Admit: 2021-09-04 | Discharge: 2021-09-04 | Disposition: A | Discharge status: Home / Self Care | Payer: 59 | Source: Ambulatory Visit | Attending: Family Medicine | Admitting: Family Medicine

## 2021-09-04 ENCOUNTER — Other Ambulatory Visit: Payer: Self-pay

## 2021-09-04 DIAGNOSIS — Z1231 Encounter for screening mammogram for malignant neoplasm of breast: Secondary | ICD-10-CM | POA: Diagnosis not present

## 2021-09-05 NOTE — Telephone Encounter (Signed)
Patient aware and is going to call the [facility] for pricing information. ?

## 2021-09-05 NOTE — Telephone Encounter (Signed)
There won't be a claim until she returns the device. I can't make a claim. That would come from [facility]. She needs to call [employer] for pricing information, not the insurance company. ?

## 2021-09-05 NOTE — Telephone Encounter (Signed)
Joanne Wade from [place] stated to be able to provide pt with a price for medical equipment insurance needs a claim from the PCP. ?Unable to determine pt deductible without a claim.   ? ?Joanne Wade requesting a call back for clarification for [employer] and pt.  ? ?[phone] ?Fax- [phone] ?

## 2021-09-19 ENCOUNTER — Telehealth: Payer: Self-pay

## 2021-09-19 ENCOUNTER — Ambulatory Visit: Payer: Self-pay | Admitting: *Deleted

## 2021-09-19 NOTE — Telephone Encounter (Signed)
Summary: burning and pressure when urinating  ? Pt called , says has UTI, burning when urinating, and pressure below. She is out of town,She is asking for prescription to be sent in, also for yeast infection just in care she gets one with medicine.. Pharmacy is  [facility], [place]  ?Phone: [phone]  ?Fax: [phone]   ?  ? ? ? ?Chief Complaint: requesting medication due to burning and pressure urinating  ?Symptoms: back pain, burning and pressure urinating  ?Frequency: started last night  ?Pertinent Negatives: Patient denies fever ?Disposition: '[]'$ ED /'[x]'$ Urgent Care (no appt availability in office) / '[]'$ Appointment(In office/virtual)/ '[]'$  [place] [facility]/ '[]'$ Home Care/ '[x]'$ Refused Recommended Disposition /'[]'$ [place] [facility]/ '[x]'$  Follow-up with PCP ?Additional Notes:  ? ?Patient is out of town returning later today. Reports sx were discussed with PCP for medication. Patient requesting a call back today if she is unable to get medication and would like appt scheduled if PCP requires. Please advise. Requesting medication called in to [facility]   ? ? ? ?Reason for Disposition ? Side (flank) or lower back pain present ? ?Answer Assessment - Initial Assessment Questions ?1. SYMPTOM: "What's the main symptom you're concerned about?" (e.g., frequency, incontinence) ?    Burning and pressure during urination ?2. ONSET: "When did the  sx   start?" ?    Last night  ?3. PAIN: "Is there any pain?" If Yes, ask: "How bad is it?" (Scale: 1-10; mild, moderate, severe) ?    With AZO now moderate  ?4. CAUSE: "What do you think is causing the symptoms?" ?   UTI ?5. OTHER SYMPTOMS: "Do you have any other symptoms?" (e.g., fever, flank pain, blood in urine, pain with urination) ?    Back pain , pain with urination burning  ?6. PREGNANCY: "Is there any chance you are pregnant?" "When was your last menstrual period?" ?    na ? ?Protocols used: Urinary  Symptoms-A-AH ? ?

## 2021-09-19 NOTE — Telephone Encounter (Signed)
Copied from [place] [id number]. Topic: General - Call Back - No Documentation ?>> Sep 19, 2021  1:51 PM Erick Blinks wrote: ?Reason for CRM: Pt called to check status of request, wants a call back from the office to know what she needs to do about her UTI. Waiting for a virtual appt. ?

## 2021-09-19 NOTE — Progress Notes (Signed)
?  ? ? ?Established patient visit ? ? ?Patient: Joanne Wade   DOB: 1966/02/04   56 y.o. Female  MRN: [id number] ?Visit Date: 09/20/2021 ? ?Today's healthcare provider: Mardene Speak, PA-C  ?CC: Urinary symptoms ? ?HPI ?Patient presents today for pain during urination.  ? ?Urinary symptoms ? ?She reports new onset dysuria, urinary frequency, and urinary urgency. The current episode started a few days ago and is improving. Patient states symptoms are mild in intensity, occurring constantly. She  has been treated for similar symptoms in November of 2022. Reports using AZO for 2 days and asked friend for a few antibiotics "that started with a C" till she could see the doctor.  Took 2 last night and 1 this morning. She asked for antibiotics to complete ?a full course. ?  ?Associated symptoms: ?No abdominal pain Yes back pain  ?Yes chills No constipation  ?No cramping No diarrhea  ?No discharge No fever  ?No hematuria No nausea  ?No vomiting   ?                                                                                                                                                                                                                                                                                                                                                                                                                                                                        ? ?  ? ?  Medications: ?Outpatient Medications Prior to Visit  ?Medication Sig  ? butalbital-acetaminophen-caffeine (FIORICET, ESGIC) 50-325-40 MG tablet Take 1 tablet by mouth 2 (two) times daily as needed for headache (Every 6 hours as needed for headache).  ? fluticasone (FLONASE) 50 MCG/ACT nasal spray Place 2 sprays into both nostrils daily.  ? montelukast (SINGULAIR) 10 MG tablet Take 1 tablet (10 mg total) by mouth at bedtime.  ? Naltrexone-buPROPion HCl ER 8-90 MG TB12 Start 1 tablet every morning for 7 days, then 1 tablet  twice daily for 7 days, then 2 tablets every morning and one in the evening  ? ondansetron (ZOFRAN) 4 MG tablet Take 1 tablet (4 mg total) by mouth 3 (three) times daily as needed for nausea or vomiting.  ? ?No facility-administered medications prior to visit.  ? ? ?Review of Systems  ?Genitourinary:  Negative for difficulty urinating.  ? ?  Objective  ?  ?BP 129/81 (BP Location: Right Arm, Patient Position: Sitting, Cuff Size: Normal)   Pulse 66   Temp 98.4 ?F (36.9 ?C) (Oral)   Resp 14   Wt 172 lb (78 kg)   SpO2 98%   BMI 27.76 kg/m?  ? ? ?Physical Exam ?Vitals and nursing note reviewed.  ?Constitutional:   ?   General: She is in acute distress.  ?   Appearance: Normal appearance.  ?HENT:  ?   Head: Normocephalic and atraumatic.  ?Cardiovascular:  ?   Rate and Rhythm: Normal rate.  ?   Pulses: Normal pulses.  ?Pulmonary:  ?   Effort: Pulmonary effort is normal.  ?Abdominal:  ?   Tenderness: There is no abdominal tenderness. There is no right CVA tenderness, left CVA tenderness or guarding.  ?Neurological:  ?   Mental Status: She is alert.  ?Psychiatric:     ?   Behavior: Behavior normal.     ?   Thought Content: Thought content normal.     ?   Judgment: Judgment normal.  ?  ? Assessment & Plan  ?  ? ?1. Painful urination ?2/2 uncomplicated UTI ?- Urinalysis, microscopic only pending ?- Urine Culture pending ?- POCT Urinalysis Dipstick ?- fluconazole (DIFLUCAN) 150 MG tablet; Take 1 tablet (150 mg total) by mouth once for 1 dose.  Dispense: 1 tablet; Refill: 0 ?- cephALEXin (KEFLEX) 500 MG capsule; Take 1 capsule (500 mg total) by mouth 2 (two) times daily.  Dispense: 10 capsule; Refill: 0 ? ?FU PRN ?   ?The patient was advised to call back or seek an in-person evaluation if the symptoms worsen or if the condition fails to improve as anticipated. ? ?I discussed the assessment and treatment plan with the patient. The patient was provided an opportunity to ask questions and all were answered. The patient agreed  with the plan and demonstrated an understanding of the instructions. ? ?The entirety of the information documented in the History of Present Illness, Review of Systems and Physical Exam were personally obtained by me. Portions of this information were initially documented by the CMA and reviewed by me for thoroughness and accuracy.  ? ? ?I,Jaevon Paras Robinson,acting as a Education administrator for [employer], PA-C.,have documented all relevant documentation on the behalf of Mardene Speak, PA-C,as directed by  [employer], PA-C while in the presence of [employer], PA-C.  ? ? ?Mardene Speak, PA-C  ?[facility] ?[phone] (phone) ?[phone] (fax) ? ?[facility] ?

## 2021-09-20 ENCOUNTER — Encounter: Payer: Self-pay | Admitting: Physician Assistant

## 2021-09-20 ENCOUNTER — Ambulatory Visit (INDEPENDENT_AMBULATORY_CARE_PROVIDER_SITE_OTHER): Payer: 59 | Admitting: Physician Assistant

## 2021-09-20 VITALS — BP 129/81 | HR 66 | Temp 98.4°F | Resp 14 | Wt 172.0 lb

## 2021-09-20 DIAGNOSIS — R309 Painful micturition, unspecified: Secondary | ICD-10-CM

## 2021-09-20 LAB — POCT URINALYSIS DIPSTICK
Bilirubin, UA: NEGATIVE
Glucose, UA: NEGATIVE
Ketones, UA: NEGATIVE
Nitrite, UA: NEGATIVE
Protein, UA: NEGATIVE
Spec Grav, UA: 1.015 (ref 1.010–1.025)
Urobilinogen, UA: NEGATIVE E.U./dL — AB
pH, UA: 5.5 (ref 5.0–8.0)

## 2021-09-20 MED ORDER — CEPHALEXIN 500 MG PO CAPS: 500.0000 mg | ORAL_CAPSULE | Freq: Two times a day (BID) | ORAL | 0 refills | Status: DC

## 2021-09-20 MED ORDER — NITROFURANTOIN MONOHYD MACRO 100 MG PO CAPS: 100.0000 mg | ORAL_CAPSULE | Freq: Two times a day (BID) | ORAL | 0 refills | Status: DC

## 2021-09-20 MED ORDER — FLUCONAZOLE 150 MG PO TABS: 150.0000 mg | ORAL_TABLET | Freq: Once | ORAL | 0 refills | Status: AC

## 2021-09-24 DIAGNOSIS — R309 Painful micturition, unspecified: Secondary | ICD-10-CM | POA: Diagnosis not present

## 2021-09-25 LAB — SPECIMEN STATUS REPORT

## 2021-09-25 LAB — URINALYSIS, MICROSCOPIC ONLY
Bacteria, UA: NONE SEEN
Casts: NONE SEEN /lpf
Epithelial Cells (non renal): NONE SEEN /hpf (ref 0–10)
WBC, UA: NONE SEEN /hpf (ref 0–5)

## 2021-10-01 DIAGNOSIS — K635 Polyp of colon: Secondary | ICD-10-CM

## 2021-10-03 DIAGNOSIS — R202 Paresthesia of skin: Secondary | ICD-10-CM | POA: Diagnosis not present

## 2021-10-03 DIAGNOSIS — D2262 Melanocytic nevi of left upper limb, including shoulder: Secondary | ICD-10-CM | POA: Diagnosis not present

## 2021-10-03 DIAGNOSIS — Z85828 Personal history of other malignant neoplasm of skin: Secondary | ICD-10-CM | POA: Diagnosis not present

## 2021-10-03 DIAGNOSIS — D2261 Melanocytic nevi of right upper limb, including shoulder: Secondary | ICD-10-CM | POA: Diagnosis not present

## 2021-10-03 DIAGNOSIS — L814 Other melanin hyperpigmentation: Secondary | ICD-10-CM | POA: Diagnosis not present

## 2021-10-03 DIAGNOSIS — D2272 Melanocytic nevi of left lower limb, including hip: Secondary | ICD-10-CM | POA: Diagnosis not present

## 2021-10-04 ENCOUNTER — Encounter: Payer: Self-pay | Admitting: Family Medicine

## 2021-11-14 DIAGNOSIS — Z0000 Encounter for general adult medical examination without abnormal findings: Secondary | ICD-10-CM | POA: Diagnosis not present

## 2021-11-14 DIAGNOSIS — E785 Hyperlipidemia, unspecified: Secondary | ICD-10-CM | POA: Diagnosis not present

## 2021-11-14 DIAGNOSIS — R739 Hyperglycemia, unspecified: Secondary | ICD-10-CM | POA: Diagnosis not present

## 2021-11-14 DIAGNOSIS — E663 Overweight: Secondary | ICD-10-CM | POA: Diagnosis not present

## 2021-11-14 DIAGNOSIS — R002 Palpitations: Secondary | ICD-10-CM | POA: Diagnosis not present

## 2021-11-15 LAB — CBC
Hematocrit: 42.5 % (ref 34.0–46.6)
Hemoglobin: 13.8 g/dL (ref 11.1–15.9)
MCH: 28.2 pg (ref 26.6–33.0)
MCHC: 32.5 g/dL (ref 31.5–35.7)
MCV: 87 fL (ref 79–97)
Platelets: 274 10*3/uL (ref 150–450)
RBC: 4.9 x10E6/uL (ref 3.77–5.28)
RDW: 13 % (ref 11.7–15.4)
WBC: 5 10*3/uL (ref 3.4–10.8)

## 2021-11-15 LAB — LIPID PANEL
Chol/HDL Ratio: 4.3 ratio (ref 0.0–4.4)
Cholesterol, Total: 215 mg/dL — ABNORMAL HIGH (ref 100–199)
HDL: 50 mg/dL (ref >39)
LDL Chol Calc (NIH): 152 mg/dL — ABNORMAL HIGH (ref 0–99)
Triglycerides: 73 mg/dL (ref 0–149)
VLDL Cholesterol Cal: 13 mg/dL (ref 5–40)

## 2021-11-15 LAB — COMPREHENSIVE METABOLIC PANEL
ALT: 20 IU/L (ref 0–32)
AST: 19 IU/L (ref 0–40)
Albumin/Globulin Ratio: 1.9 (ref 1.2–2.2)
Albumin: 4.5 g/dL (ref 3.8–4.9)
Alkaline Phosphatase: 93 IU/L (ref 44–121)
BUN/Creatinine Ratio: 18 (ref 9–23)
BUN: 19 mg/dL (ref 6–24)
Bilirubin Total: 0.3 mg/dL (ref 0.0–1.2)
CO2: 21 mmol/L (ref 20–29)
Calcium: 9 mg/dL (ref 8.7–10.2)
Chloride: 104 mmol/L (ref 96–106)
Creatinine, Ser: 1.04 mg/dL — ABNORMAL HIGH (ref 0.57–1.00)
Globulin, Total: 2.4 g/dL (ref 1.5–4.5)
Glucose: 99 mg/dL (ref 70–99)
Potassium: 4.6 mmol/L (ref 3.5–5.2)
Sodium: 139 mmol/L (ref 134–144)
Total Protein: 6.9 g/dL (ref 6.0–8.5)
eGFR: 64 mL/min/{1.73_m2} (ref >59)

## 2021-11-15 LAB — TSH: TSH: 1.57 u[IU]/mL (ref 0.450–4.500)

## 2021-11-15 LAB — HEMOGLOBIN A1C
Est. average glucose Bld gHb Est-mCnc: 105 mg/dL
Hgb A1c MFr Bld: 5.3 % (ref 4.8–5.6)

## 2022-01-23 ENCOUNTER — Ambulatory Visit: Payer: Self-pay | Admitting: *Deleted

## 2022-01-23 ENCOUNTER — Ambulatory Visit: Payer: 59 | Admitting: Family Medicine

## 2022-01-23 NOTE — Telephone Encounter (Signed)
  Chief Complaint: R ear pain-bilateral ear pain, recent air travel Symptoms: pain, sore throat, neck pain Frequency: started 01/16/22 Pertinent Negatives: Patient denies fever Disposition: '[]'$ ED /'[]'$ Urgent Care (no appt availability in office) / '[x]'$ Appointment(In office/virtual)/ '[]'$  [place] [facility]/ '[]'$ Home Care/ '[]'$ Refused Recommended Disposition /'[]'$ [place] [facility]/ '[]'$  Follow-up with PCP Additional Notes:

## 2022-01-23 NOTE — Telephone Encounter (Signed)
Reason for Disposition  Earache  (Exceptions: brief ear pain of < 60 minutes duration, earache occurring during air travel  Answer Assessment - Initial Assessment Questions 1. LOCATION: "Which ear is involved?"     Right ear- worse, left is painful 2. ONSET: "When did the ear start hurting"      01/16/22 3. SEVERITY: "How bad is the pain?"  (Scale 1-10; mild, moderate or severe)   - MILD (1-3): doesn't interfere with normal activities    - MODERATE (4-7): interferes with normal activities or awakens from sleep    - SEVERE (8-10): excruciating pain, unable to do any normal activities      severe 4. URI SYMPTOMS: "Do you have a runny nose or cough?"     Sore throat 5. FEVER: "Do you have a fever?" If Yes, ask: "What is your temperature, how was it measured, and when did it start?"     no 6. CAUSE: "Have you been swimming recently?", "How often do you use Q-TIPS?", "Have you had any recent air travel or scuba diving?"     Recent air travel 7. OTHER SYMPTOMS: "Do you have any other symptoms?" (e.g., headache, stiff neck, dizziness, vomiting, runny nose, decreased hearing)       8. PREGNANCY: "Is there any chance you are pregnant?" "When was your last menstrual period?"  Protocols used: Bethann Punches

## 2022-01-24 ENCOUNTER — Encounter: Payer: Self-pay | Admitting: Family Medicine

## 2022-01-24 ENCOUNTER — Ambulatory Visit: Payer: 59 | Admitting: Family Medicine

## 2022-01-24 VITALS — BP 128/86 | HR 60 | Temp 98.2°F | Resp 16 | Wt 167.0 lb

## 2022-01-24 DIAGNOSIS — H6983 Other specified disorders of Eustachian tube, bilateral: Secondary | ICD-10-CM

## 2022-01-24 DIAGNOSIS — H9203 Otalgia, bilateral: Secondary | ICD-10-CM

## 2022-01-24 DIAGNOSIS — H938X3 Other specified disorders of ear, bilateral: Secondary | ICD-10-CM | POA: Diagnosis not present

## 2022-01-24 DIAGNOSIS — H6993 Unspecified Eustachian tube disorder, bilateral: Secondary | ICD-10-CM | POA: Insufficient documentation

## 2022-01-24 MED ORDER — PREDNISONE 10 MG (21) PO TBPK: ORAL_TABLET | ORAL | 0 refills | Status: DC

## 2022-01-24 MED ORDER — AMOXICILLIN-POT CLAVULANATE 875-125 MG PO TABS: 1.0000 | ORAL_TABLET | Freq: Two times a day (BID) | ORAL | 0 refills | Status: DC

## 2022-01-24 NOTE — Assessment & Plan Note (Signed)
Acute on chronic, s/p flying Previously has been seen by ENT- Joanne Wade Takes flonase and singulair R>L pain Concern for upcoming beach trip with water exacerbating current symptoms

## 2022-01-24 NOTE — Assessment & Plan Note (Signed)
Acute on chronic, s/p flying Previously has been seen by ENT- Tami Ribas Takes flonase and singulair R>L pain Concern for upcoming beach trip with water exacerbating current symptoms

## 2022-01-24 NOTE — Progress Notes (Signed)
I,Sulibeya S Dimas,acting as a Education administrator for Joanne Sprout, FNP.,have documented all relevant documentation on the behalf of Joanne Sprout, FNP,as directed by  Joanne Sprout, FNP while in the presence of Joanne Sprout, FNP.   Established patient visit   Patient: Joanne Wade   DOB: 02/07/1966   56 y.o. Female  MRN: [id number] Visit Date: 01/24/2022  Today's healthcare provider: [name], FNP  Introduced to nurse practitioner role and practice setting.  All questions answered.  Discussed provider/patient relationship and expectations.   Chief Complaint  Patient presents with   Ear Pain   Subjective    Otalgia  There is pain in both ears. This is a new problem. The current episode started in the past 7 days. The problem occurs constantly. The problem has been gradually worsening. There has been no fever. The pain is mild. Associated symptoms include a sore throat. Pertinent negatives include no coughing, ear discharge or rhinorrhea. She has tried nothing for the symptoms.     Medications: Outpatient Medications Prior to Visit  Medication Sig   butalbital-acetaminophen-caffeine (FIORICET, ESGIC) 50-325-40 MG tablet Take 1 tablet by mouth 2 (two) times daily as needed for headache (Every 6 hours as needed for headache).   fluticasone (FLONASE) 50 MCG/ACT nasal spray Place 2 sprays into both nostrils daily.   montelukast (SINGULAIR) 10 MG tablet Take 1 tablet (10 mg total) by mouth at bedtime.   SEMAGLUTIDE, 2 MG/DOSE, [place] Inject into the skin.   [DISCONTINUED] cephALEXin (KEFLEX) 500 MG capsule Take 1 capsule (500 mg total) by mouth 2 (two) times daily. (Patient not taking: Reported on 01/24/2022)   No facility-administered medications prior to visit.    Review of Systems  Constitutional:  Negative for appetite change, chills and fever.  HENT:  Positive for ear pain and sore throat. Negative for ear discharge and rhinorrhea.   Respiratory:  Negative for cough.     Last  CBC Lab Results  Component Value Date   WBC 5.0 11/14/2021   HGB 13.8 11/14/2021   HCT 42.5 11/14/2021   MCV 87 11/14/2021   MCH 28.2 11/14/2021   RDW 13.0 11/14/2021   PLT 274 29/52/8413   Last metabolic panel Lab Results  Component Value Date   GLUCOSE 99 11/14/2021   NA 139 11/14/2021   K 4.6 11/14/2021   CL 104 11/14/2021   CO2 21 11/14/2021   BUN 19 11/14/2021   CREATININE 1.04 (H) 11/14/2021   EGFR 64 11/14/2021   CALCIUM 9.0 11/14/2021   PHOS 4.1 02/22/2021   PROT 6.9 11/14/2021   ALBUMIN 4.5 11/14/2021   LABGLOB 2.4 11/14/2021   AGRATIO 1.9 11/14/2021   BILITOT 0.3 11/14/2021   ALKPHOS 93 11/14/2021   AST 19 11/14/2021   ALT 20 11/14/2021   ANIONGAP 6 12/12/2014   Last lipids Lab Results  Component Value Date   CHOL 215 (H) 11/14/2021   HDL 50 11/14/2021   LDLCALC 152 (H) 11/14/2021   TRIG 73 11/14/2021   CHOLHDL 4.3 11/14/2021   Last hemoglobin A1c Lab Results  Component Value Date   HGBA1C 5.3 11/14/2021   Last thyroid functions Lab Results  Component Value Date   TSH 1.570 11/14/2021   T4TOTAL 7.6 09/29/2015   Last vitamin D Lab Results  Component Value Date   VD25OH 39.4 01/08/2021   Last vitamin B12 and Folate Lab Results  Component Value Date   VITAMINB12 598 01/08/2021  Objective    BP 128/86 (BP Location: Right Arm, Patient Position: Sitting, Cuff Size: Large)   Pulse 60   Temp 98.2 F (36.8 C) (Oral)   Resp 16   Wt 167 lb (75.8 kg)   SpO2 100%   BMI 26.95 kg/m   BP Readings from Last 3 Encounters:  01/24/22 128/86  09/20/21 129/81  08/28/21 107/69   Wt Readings from Last 3 Encounters:  01/24/22 167 lb (75.8 kg)  09/20/21 172 lb (78 kg)  08/28/21 168 lb 11.2 oz (76.5 kg)   SpO2 Readings from Last 3 Encounters:  01/24/22 100%  09/20/21 98%  05/07/21 99%      Physical Exam Vitals and nursing note reviewed.  Constitutional:      General: She is not in acute distress.    Appearance: Normal appearance.  She is overweight. She is not ill-appearing, toxic-appearing or diaphoretic.  HENT:     Head: Normocephalic and atraumatic.     Right Ear: Tympanic membrane, ear canal and external ear normal. Swelling and tenderness present. There is no impacted cerumen.     Left Ear: Tympanic membrane, ear canal and external ear normal. Swelling and tenderness present. There is no impacted cerumen.     Ears:     Comments: Pale TMs    Nose: Nose normal.     Mouth/Throat:     Mouth: Mucous membranes are moist.     Pharynx: Oropharynx is clear. No oropharyngeal exudate.  Eyes:     Pupils: Pupils are equal, round, and reactive to light.  Cardiovascular:     Rate and Rhythm: Normal rate and regular rhythm.     Pulses: Normal pulses.     Heart sounds: Normal heart sounds. No murmur heard.    No friction rub. No gallop.  Pulmonary:     Effort: Pulmonary effort is normal. No respiratory distress.     Breath sounds: Normal breath sounds. No stridor. No wheezing, rhonchi or rales.  Chest:     Chest wall: No tenderness.  Musculoskeletal:        General: No swelling, deformity or signs of injury. Normal range of motion.     Cervical back: Normal range of motion and neck supple. Tenderness present.     Right lower leg: No edema.     Left lower leg: No edema.  Skin:    General: Skin is warm and dry.     Capillary Refill: Capillary refill takes less than 2 seconds.     Coloration: Skin is not jaundiced or pale.     Findings: No bruising, erythema, lesion or rash.  Neurological:     General: No focal deficit present.     Mental Status: She is alert and oriented to person, place, and time. Mental status is at baseline.     Cranial Nerves: No cranial nerve deficit.     Sensory: No sensory deficit.     Motor: No weakness.     Coordination: Coordination normal.  Psychiatric:        Mood and Affect: Mood normal.        Behavior: Behavior normal.        Thought Content: Thought content normal.         Judgment: Judgment normal.     No results found for any visits on 01/24/22.  Assessment & Plan     Problem List Items Addressed This Visit       Nervous and Auditory   Ear fullness, bilateral  Acute on chronic, s/p flying Previously has been seen by ENT- Tami Ribas Takes flonase and singulair R>L pain Concern for upcoming beach trip with water exacerbating current symptoms        Relevant Medications   amoxicillin-clavulanate (AUGMENTIN) 875-125 MG tablet   predniSONE (STERAPRED UNI-PAK 21 TAB) 10 MG (21) TBPK tablet   Eustachian tube dysfunction, bilateral - Primary    Acute on chronic, s/p flying Previously has been seen by ENT- Tami Ribas Takes flonase and singulair R>L pain Concern for upcoming beach trip with water exacerbating current symptoms       Relevant Medications   amoxicillin-clavulanate (AUGMENTIN) 875-125 MG tablet   predniSONE (STERAPRED UNI-PAK 21 TAB) 10 MG (21) TBPK tablet     Other   Ear pain, bilateral    Acute on chronic, s/p flying Previously has been seen by ENT- Tami Ribas Takes flonase and singulair R>L pain Concern for upcoming beach trip with water exacerbating current symptoms        Relevant Medications   amoxicillin-clavulanate (AUGMENTIN) 875-125 MG tablet   predniSONE (STERAPRED UNI-PAK 21 TAB) 10 MG (21) TBPK tablet      Return if symptoms worsen or fail to improve.      Vonna Kotyk, FNP, have reviewed all documentation for this visit. The documentation on 01/24/22 for the exam, diagnosis, procedures, and orders are all accurate and complete.    Joanne Wade, [place] [phone] (phone) [phone] (fax)  [place]

## 2022-02-04 ENCOUNTER — Other Ambulatory Visit: Payer: Self-pay | Admitting: Family Medicine

## 2022-05-11 ENCOUNTER — Other Ambulatory Visit: Payer: Self-pay | Admitting: Family Medicine

## 2022-05-13 NOTE — Telephone Encounter (Signed)
Unable to refill per protocol, Rx request is too soon. Last refill 08/28/21 for 90 and 3 refill.E-Prescribing Status: Receipt confirmed by pharmacy (08/28/2021 11:32 AM EST). Will refuse.  Requested Prescriptions  Pending Prescriptions Disp Refills   montelukast (SINGULAIR) 10 MG tablet [Pharmacy Med Name: MONTELUKAST SODIUM 10 MG TAB] 90 tablet 3    Sig: TAKE ONE TABLET BY MOUTH AT BEDTIME     Pulmonology:  Leukotriene Inhibitors Passed - 05/11/2022  7:41 AM      Passed - Valid encounter within last 12 months    Recent Outpatient Visits           3 months ago Eustachian tube dysfunction, bilateral   [facility] [name], FNP   7 months ago Painful urination   [employer], [place], PA-C   8 months ago Encounter for annual physical exam   [facility] [place], Dionne Bucy, MD   1 year ago Elevated blood pressure reading   [facility], Dionne Bucy, MD   1 year ago Elevated blood pressure reading   [facility], Dionne Bucy, MD       Future Appointments             In 3 months Bacigalupo, Dionne Bucy, MD [facility] [place] [facility], [place]

## 2022-06-05 ENCOUNTER — Encounter: Payer: Self-pay | Admitting: Family Medicine

## 2022-06-05 ENCOUNTER — Ambulatory Visit: Payer: 59 | Admitting: Family Medicine

## 2022-06-05 VITALS — BP 138/84 | HR 67 | Resp 14

## 2022-06-05 DIAGNOSIS — B3731 Acute candidiasis of vulva and vagina: Secondary | ICD-10-CM

## 2022-06-05 DIAGNOSIS — R300 Dysuria: Secondary | ICD-10-CM

## 2022-06-05 LAB — POCT URINALYSIS DIPSTICK: Blood, UA: POSITIVE

## 2022-06-05 MED ORDER — FLUCONAZOLE 150 MG PO TABS: 150.0000 mg | ORAL_TABLET | Freq: Once | ORAL | 0 refills | Status: AC

## 2022-06-05 MED ORDER — CEPHALEXIN 500 MG PO CAPS: 500.0000 mg | ORAL_CAPSULE | Freq: Four times a day (QID) | ORAL | 0 refills | Status: DC

## 2022-06-05 NOTE — Progress Notes (Signed)
   SUBJECTIVE:   CHIEF COMPLAINT / HPI:   URINARY SYMPTOMS - symptom onset this morning  Dysuria: burning Urinary frequency: yes Urgency: yes Urinary incontinence: no Hematuria: no Abdominal pain: no Back pain: no Suprapubic pain/pressure: yes Flank pain: no Fever:  subjective Vomiting: no Relief with pyridium: yes Previous urinary tract infection: yes Recurrent urinary tract infection: no Sexual activity: monogamous History of sexually transmitted disease: no Vaginal discharge: no Treatments attempted: pyridium and increasing fluids    OBJECTIVE:   BP 138/84 (BP Location: Right Arm, Patient Position: Sitting, Cuff Size: Normal)   Pulse 67   Resp 14   SpO2 99%   Gen: well appearing, in NAD Card: Reg rate Lungs: Comfortable WOB on RA Ext: WWP, no edema   ASSESSMENT/PLAN:   Dysuria UA c/w infection with +leuks, blood. Rx keflex x5 days. Will send for culture and adjust regimen as indicated. Consider vaginal estrogen for UTI prevention if becomes recurrent given post-menopausal status and typically occurs post-coital. F/u prn.    Myles Gip, DO

## 2022-06-06 LAB — URINALYSIS, ROUTINE W REFLEX MICROSCOPIC

## 2022-06-06 LAB — SPECIMEN STATUS REPORT

## 2022-06-08 LAB — SPECIMEN STATUS REPORT

## 2022-06-08 LAB — CULTURE, URINE COMPREHENSIVE

## 2022-06-10 ENCOUNTER — Encounter: Payer: Self-pay | Admitting: *Deleted

## 2022-06-10 MED ORDER — NITROFURANTOIN MONOHYD MACRO 100 MG PO CAPS: 100.0000 mg | ORAL_CAPSULE | Freq: Two times a day (BID) | ORAL | 0 refills | Status: AC

## 2022-06-10 NOTE — Addendum Note (Signed)
Addended by: Myles Gip on: 06/10/2022 09:26 AM   Modules accepted: Orders

## 2022-06-14 ENCOUNTER — Ambulatory Visit: Payer: Self-pay | Admitting: *Deleted

## 2022-06-14 MED ORDER — SULFAMETHOXAZOLE-TRIMETHOPRIM 800-160 MG PO TABS: 1.0000 | ORAL_TABLET | Freq: Two times a day (BID) | ORAL | 0 refills | Status: AC

## 2022-06-14 NOTE — Telephone Encounter (Signed)
Please advise 

## 2022-06-14 NOTE — Telephone Encounter (Signed)
Patient advised.

## 2022-06-14 NOTE — Telephone Encounter (Signed)
Summary: UTI / Burning   Pt was given a RX for a UTI but was then sent a new RX / pt stated she doesn't think the medication is working and she still has burning / please advise / pt asked if she may need another round of the medication or if she needs a different med         Reason for Disposition  [1] Taking antibiotic > 72 hours (3 days) for UTI AND [2] painful urination or frequency is SAME (unchanged, not better)    Patient feels she is better in some ways but does not think that this is going to be clear by tomorrow ginen symptoms she still has.  Answer Assessment - Initial Assessment Questions 1. MAIN SYMPTOM: "What is the main symptom you are concerned about?" (e.g., painful urination, urine frequency)     Currently taking antibiotic- second medication- burning with urination 2. BETTER-SAME-WORSE: "Are you getting better, staying the same, or getting worse compared to how you felt at your last visit to the doctor (most recent medical visit)?"     Comes and goes- better- same, feels like she has chills and back pain, pressure and burning still present 3. PAIN: "How bad is the pain?"  (e.g., Scale 1-10; mild, moderate, or severe)   - MILD (1-3): complains slightly about urination hurting   - MODERATE (4-7): interferes with normal activities     - SEVERE (8-10): excruciating, unwilling or unable to urinate because of the pain      Mild/moderate 4. FEVER: "Do you have a fever?" If Yes, ask: "What is it, how was it measured, and when did it start?"     Has chill flashes- not checked 5. OTHER SYMPTOMS: "Do you have any other symptoms?" (e.g., blood in the urine, flank pain, vaginal discharge)     no 6. DIAGNOSIS: "When was the UTI diagnosed?" "By whom?" "Was it a kidney infection, bladder infection or both?"     UTI 7. ANTIBIOTIC: "What antibiotic(s) are you taking?" "How many times per day?"     Macrobid '100mg'$  bid- 4th day- 5 day Rx 8. ANTIBIOTIC - START DATE: "When did you start  taking the antibiotic?"     4 days ago  Protocols used: Urinary Tract Infection on Antibiotic Follow-up Call - [facility]

## 2022-06-14 NOTE — Addendum Note (Signed)
Addended by: Shawna Orleans on: 06/14/2022 12:22 PM   Modules accepted: Orders

## 2022-06-14 NOTE — Telephone Encounter (Signed)
From Urine cultrue from 12/13, she did have intermediate sensitivity to Keflex that was Rx'd. Ok to send in Bactrim DS 1 tab BID 5 days #10 r0.  If not improving, needs f/u appt to reassess.

## 2022-07-03 NOTE — Progress Notes (Unsigned)
I,Trong Gosling S Parvin Stetzer,acting as a Education administrator for Lavon Paganini, MD.,have documented all relevant documentation on the behalf of Lavon Paganini, MD,as directed by  Lavon Paganini, MD while in the presence of Lavon Paganini, MD.     Established patient visit   Patient: Joanne Wade   DOB: 1966-04-25   57 y.o. Female  MRN: [id number] Visit Date: 07/04/2022  Today's healthcare provider: Lavon Paganini, MD   Chief Complaint  Patient presents with   Medication Problem   Subjective    HPI  Patient reports she was treated for UTI on 06/05/22. Her ABX was changed 3 times (Keflex - intermediate sensitivity, macrobid, and Bactrim). She reports the last time her ABX was changed she had some side effects. She reports she is not sure if the ABX caused her symptoms.  She reports after taking Bactrim DS she got a weird feeling in her head. Sensation lasted about 3-4 hours. She reports feeling was like a "fire work" going of in her head. Had taken the last dose the day before. Hasn't happened since.  Feeling very fatigued. Felt presyncopal when she was picking up sticks in the yard yesterday.  DOE when walking up stairs for 6-8 months, but getting worse. Continues to have intermittent palpitations.  Medications: Outpatient Medications Prior to Visit  Medication Sig   Biotin 10000 MCG TABS Take 1 tablet by mouth daily.   butalbital-acetaminophen-caffeine (FIORICET, ESGIC) 50-325-40 MG tablet Take 1 tablet by mouth 2 (two) times daily as needed for headache (Every 6 hours as needed for headache).   fluticasone (FLONASE) 50 MCG/ACT nasal spray USE TWO SPRAYS IN EACH NOSTRIL ONCE DAILY   montelukast (SINGULAIR) 10 MG tablet Take 1 tablet (10 mg total) by mouth at bedtime.   OVER THE COUNTER MEDICATION Take 2 tablets by mouth daily. Fresh fruit & vegetable vitamin   No facility-administered medications prior to visit.    Review of Systems  Constitutional:  Negative for appetite change,  chills and fatigue.  Eyes:  Negative for visual disturbance.  Respiratory:  Negative for chest tightness and shortness of breath.   Cardiovascular:  Positive for chest pain and palpitations.  Gastrointestinal:  Negative for abdominal pain, nausea and vomiting.  Genitourinary:  Positive for flank pain.  Musculoskeletal:  Positive for back pain and myalgias.       Pain in right arm  Neurological:  Positive for dizziness and light-headedness. Negative for headaches.       Objective    BP 125/81 (BP Location: Right Arm, Patient Position: Sitting, Cuff Size: Large)   Pulse 64   Temp 98.2 F (36.8 C) (Temporal)   Resp 16   Wt 168 lb (76.2 kg)   BMI 27.12 kg/m  BP Readings from Last 3 Encounters:  07/04/22 125/81  06/05/22 138/84  01/24/22 128/86   Wt Readings from Last 3 Encounters:  07/04/22 168 lb (76.2 kg)  01/24/22 167 lb (75.8 kg)  09/20/21 172 lb (78 kg)      Physical Exam Vitals reviewed.  Constitutional:      General: She is not in acute distress.    Appearance: Normal appearance. She is well-developed. She is not diaphoretic.  HENT:     Head: Normocephalic and atraumatic.  Eyes:     General: No scleral icterus.    Conjunctiva/sclera: Conjunctivae normal.  Neck:     Thyroid: No thyromegaly.  Cardiovascular:     Rate and Rhythm: Normal rate and regular rhythm.     Pulses: Normal pulses.  Heart sounds: Normal heart sounds. No murmur heard. Pulmonary:     Effort: Pulmonary effort is normal. No respiratory distress.     Breath sounds: Normal breath sounds. No wheezing, rhonchi or rales.  Musculoskeletal:     Cervical back: Neck supple.     Right lower leg: No edema.     Left lower leg: No edema.  Lymphadenopathy:     Cervical: No cervical adenopathy.  Skin:    General: Skin is warm and dry.     Findings: No rash.  Neurological:     Mental Status: She is alert and oriented to person, place, and time. Mental status is at baseline.     Cranial Nerves: No  cranial nerve deficit.     Sensory: No sensory deficit.     Motor: No weakness.     Gait: Gait normal.  Psychiatric:        Mood and Affect: Mood normal.        Behavior: Behavior normal.      No results found for any visits on 07/04/22.  Assessment & Plan     Problem List Items Addressed This Visit       Cardiovascular and Mediastinum   Migraine    Fireworks feeling may have been med reaction vs migraine without her typical aura The duration seems consistent with migraine Neuro intact today and no recurrence      Pre-syncope    As below, concern for possible CAD given DOE and fatigue      Relevant Orders   TSH   CBC with Differential/Platelet   Comprehensive metabolic panel   Vitamin V49   VITAMIN D 25 Hydroxy (Vit-D Deficiency, Fractures)     Other   Chronic fatigue    Longstanding, but acutely worsening Will check CMP, CBC, Vit D, B12, TSH to evaluate for any underlying causes May be related to DOE as below      Relevant Orders   TSH   CBC with Differential/Platelet   Comprehensive metabolic panel   Vitamin S49   VITAMIN D 25 Hydroxy (Vit-D Deficiency, Fractures)   DOE (dyspnea on exertion) - Primary    New problem and worsening Concern that this could be angingal equivalent Needs cardiac workup Referral placed today Had sinus tachycardia on Zio patch last year with no other arrhythmia      Relevant Orders   TSH   CBC with Differential/Platelet   Comprehensive metabolic panel   Vitamin Q75   VITAMIN D 25 Hydroxy (Vit-D Deficiency, Fractures)   Ambulatory referral to Cardiology     Return if symptoms worsen or fail to improve.      I, Lavon Paganini, MD, have reviewed all documentation for this visit. The documentation on 07/04/22 for the exam, diagnosis, procedures, and orders are all accurate and complete.   Bacigalupo, Dionne Bucy, MD, MPH [facility]

## 2022-07-04 ENCOUNTER — Encounter: Payer: Self-pay | Admitting: Family Medicine

## 2022-07-04 ENCOUNTER — Ambulatory Visit: Payer: 59 | Admitting: Family Medicine

## 2022-07-04 VITALS — BP 125/81 | HR 64 | Temp 98.2°F | Resp 16 | Wt 168.0 lb

## 2022-07-04 DIAGNOSIS — R55 Syncope and collapse: Secondary | ICD-10-CM | POA: Diagnosis not present

## 2022-07-04 DIAGNOSIS — R0609 Other forms of dyspnea: Secondary | ICD-10-CM | POA: Insufficient documentation

## 2022-07-04 DIAGNOSIS — G43109 Migraine with aura, not intractable, without status migrainosus: Secondary | ICD-10-CM | POA: Diagnosis not present

## 2022-07-04 DIAGNOSIS — R5382 Chronic fatigue, unspecified: Secondary | ICD-10-CM | POA: Diagnosis not present

## 2022-07-04 NOTE — Assessment & Plan Note (Signed)
New problem and worsening Concern that this could be angingal equivalent Needs cardiac workup Referral placed today Had sinus tachycardia on Zio patch last year with no other arrhythmia

## 2022-07-04 NOTE — Assessment & Plan Note (Signed)
As below, concern for possible CAD given DOE and fatigue

## 2022-07-04 NOTE — Assessment & Plan Note (Signed)
Longstanding, but acutely worsening Will check CMP, CBC, Vit D, B12, TSH to evaluate for any underlying causes May be related to DOE as below

## 2022-07-04 NOTE — Assessment & Plan Note (Addendum)
Fireworks feeling may have been med reaction vs migraine without her typical aura The duration seems consistent with migraine Neuro intact today and no recurrence

## 2022-07-05 LAB — CBC WITH DIFFERENTIAL/PLATELET
Basophils Absolute: 0.1 10*3/uL (ref 0.0–0.2)
Basos: 1 %
EOS (ABSOLUTE): 0.1 10*3/uL (ref 0.0–0.4)
Eos: 2 %
Hematocrit: 43 % (ref 34.0–46.6)
Hemoglobin: 14.4 g/dL (ref 11.1–15.9)
Immature Grans (Abs): 0 10*3/uL (ref 0.0–0.1)
Immature Granulocytes: 0 %
Lymphocytes Absolute: 2.5 10*3/uL (ref 0.7–3.1)
Lymphs: 35 %
MCH: 28.6 pg (ref 26.6–33.0)
MCHC: 33.5 g/dL (ref 31.5–35.7)
MCV: 85 fL (ref 79–97)
Monocytes Absolute: 0.5 10*3/uL (ref 0.1–0.9)
Monocytes: 6 %
Neutrophils Absolute: 4.1 10*3/uL (ref 1.4–7.0)
Neutrophils: 56 %
Platelets: 300 10*3/uL (ref 150–450)
RBC: 5.04 x10E6/uL (ref 3.77–5.28)
RDW: 12.6 % (ref 11.7–15.4)
WBC: 7.2 10*3/uL (ref 3.4–10.8)

## 2022-07-05 LAB — COMPREHENSIVE METABOLIC PANEL
ALT: 25 IU/L (ref 0–32)
AST: 22 IU/L (ref 0–40)
Albumin/Globulin Ratio: 1.8 (ref 1.2–2.2)
Albumin: 4.6 g/dL (ref 3.8–4.9)
Alkaline Phosphatase: 87 IU/L (ref 44–121)
BUN/Creatinine Ratio: 17 (ref 9–23)
BUN: 17 mg/dL (ref 6–24)
Bilirubin Total: <0.2 mg/dL (ref 0.0–1.2)
CO2: 24 mmol/L (ref 20–29)
Calcium: 9.6 mg/dL (ref 8.7–10.2)
Chloride: 101 mmol/L (ref 96–106)
Creatinine, Ser: 1.03 mg/dL — ABNORMAL HIGH (ref 0.57–1.00)
Globulin, Total: 2.6 g/dL (ref 1.5–4.5)
Glucose: 98 mg/dL (ref 70–99)
Potassium: 4.1 mmol/L (ref 3.5–5.2)
Sodium: 140 mmol/L (ref 134–144)
Total Protein: 7.2 g/dL (ref 6.0–8.5)
eGFR: 64 mL/min/{1.73_m2} (ref >59)

## 2022-07-05 LAB — VITAMIN B12: Vitamin B-12: 445 pg/mL (ref 232–1245)

## 2022-07-05 LAB — VITAMIN D 25 HYDROXY (VIT D DEFICIENCY, FRACTURES): Vit D, 25-Hydroxy: 27.7 ng/mL — ABNORMAL LOW (ref 30.0–100.0)

## 2022-07-05 LAB — TSH: TSH: 1.57 u[IU]/mL (ref 0.450–4.500)

## 2022-07-05 LAB — SPECIMEN STATUS REPORT

## 2022-07-05 NOTE — Progress Notes (Signed)
Pt viewed labs on Mychart.  KP

## 2022-07-30 LAB — LIPID PANEL
Cholesterol: 251 — AB (ref 0–200)
HDL: 64 (ref 35–70)
LDL Cholesterol: 162
Triglycerides: 125 (ref 40–160)

## 2022-07-30 LAB — BASIC METABOLIC PANEL: Glucose: 106

## 2022-08-02 ENCOUNTER — Other Ambulatory Visit: Payer: Self-pay | Admitting: Family Medicine

## 2022-08-02 DIAGNOSIS — Z1231 Encounter for screening mammogram for malignant neoplasm of breast: Secondary | ICD-10-CM

## 2022-08-13 ENCOUNTER — Ambulatory Visit: Payer: 59 | Admitting: Cardiology

## 2022-09-02 NOTE — Progress Notes (Unsigned)
I,Sulibeya S Dimas,acting as a Education administrator for Lavon Paganini, MD.,have documented all relevant documentation on the behalf of Lavon Paganini, MD,as directed by  Lavon Paganini, MD while in the presence of Lavon Paganini, MD.    Complete physical exam   Patient: Joanne Wade   DOB: 18-Feb-1966   57 y.o. Female  MRN: [id number] Visit Date: 09/03/2022  Today's healthcare provider: Lavon Paganini, MD   No chief complaint on file.  Subjective    Joanne Wade is a 57 y.o. female who presents today for a complete physical exam.  She reports consuming a {diet types:17450} diet. {Exercise:19826} She generally feels {well/fairly well/poorly:18703}. She reports sleeping {well/fairly well/poorly:18703}. She {does/does not:200015} have additional problems to discuss today.  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[facility])    Past Surgical History:  Procedure Laterality Date   ABDOMINAL HYSTERECTOMY     still has cervix   APPENDECTOMY     COLONOSCOPY WITH PROPOFOL N/A 11/25/2017   Procedure: COLONOSCOPY WITH PROPOFOL;  Surgeon: Lucilla Lame, MD;  Location: [facility] ENDOSCOPY;  Service: Endoscopy;  Laterality: N/A;   EYE SURGERY Bilateral    03/25/2017 and 04/15/2017 "new lenses put in my eyes"   [place]   Social History   Socioeconomic History   Marital status: Married    Spouse name: Not on file   Number of children: 2   Years of education: Not on file   Highest education level: Not on file  Occupational History   Not on file  Tobacco Use   Smoking status: Never   Smokeless tobacco: Never  Vaping Use   Vaping Use: Never used  Substance and Sexual Activity   Alcohol use: Yes    Comment: ocassional   Drug use: No   Sexual activity: Yes    Birth  control/protection: Surgical  Other Topics Concern   Not on file  Social History Narrative   Not on file   Social Determinants of Health   Financial Resource Strain: Not on file  Food Insecurity: Not on file  Transportation Needs: Not on file  Physical Activity: Not on file  Stress: Not on file  Social Connections: Not on file  Intimate Partner Violence: Not on file   Family Status  Relation Name Status   Mother  Deceased at age 57   Father  Alive   PGF  Deceased   Neg Hx  (Not Specified)   Family History  Problem Relation Age of Onset   Alcohol abuse Mother    Hypertension Mother    Melanoma Mother    Cancer Mother        brain and melanoma   Cancer Father        lung   Colon cancer Paternal Grandfather    Breast cancer Neg Hx    Allergies  Allergen Reactions   Morphine Other (See Comments)    Other Reaction: Other reaction   Belladonna Alkaloids    Lexapro [Escitalopram Oxalate]    Morphine And Related Itching   Tramadol     Patient Care Team: Virginia Crews, MD as PCP - General (Family Medicine)   Medications: Outpatient Medications Prior to Visit  Medication Sig  Biotin 10000 MCG TABS Take 1 tablet by mouth daily.   butalbital-acetaminophen-caffeine (FIORICET, ESGIC) 50-325-40 MG tablet Take 1 tablet by mouth 2 (two) times daily as needed for headache (Every 6 hours as needed for headache).   fluticasone (FLONASE) 50 MCG/ACT nasal spray USE TWO SPRAYS IN EACH NOSTRIL ONCE DAILY   montelukast (SINGULAIR) 10 MG tablet Take 1 tablet (10 mg total) by mouth at bedtime.   OVER THE COUNTER MEDICATION Take 2 tablets by mouth daily. Fresh fruit & vegetable vitamin   No facility-administered medications prior to visit.    Review of Systems  All other systems reviewed and are negative.   Last CBC Lab Results  Component Value Date   WBC 7.2 07/04/2022   HGB 14.4 07/04/2022   HCT 43.0 07/04/2022   MCV 85 07/04/2022   MCH 28.6 07/04/2022   RDW 12.6  07/04/2022   PLT 300 99991111   Last metabolic panel Lab Results  Component Value Date   GLUCOSE 98 07/04/2022   NA 140 07/04/2022   K 4.1 07/04/2022   CL 101 07/04/2022   CO2 24 07/04/2022   BUN 17 07/04/2022   CREATININE 1.03 (H) 07/04/2022   EGFR 64 07/04/2022   CALCIUM 9.6 07/04/2022   PHOS 4.1 02/22/2021   PROT 7.2 07/04/2022   ALBUMIN 4.6 07/04/2022   LABGLOB 2.6 07/04/2022   AGRATIO 1.8 07/04/2022   BILITOT <0.2 07/04/2022   ALKPHOS 87 07/04/2022   AST 22 07/04/2022   ALT 25 07/04/2022   ANIONGAP 6 12/12/2014   Last lipids Lab Results  Component Value Date   CHOL 215 (H) 11/14/2021   HDL 50 11/14/2021   LDLCALC 152 (H) 11/14/2021   TRIG 73 11/14/2021   CHOLHDL 4.3 11/14/2021   Last hemoglobin A1c Lab Results  Component Value Date   HGBA1C 5.3 11/14/2021   Last thyroid functions Lab Results  Component Value Date   TSH 1.570 07/04/2022   T4TOTAL 7.6 09/29/2015   Last vitamin D Lab Results  Component Value Date   VD25OH 27.7 (L) 07/04/2022   Last vitamin B12 and Folate Lab Results  Component Value Date   VITAMINB12 445 07/04/2022      Objective    There were no vitals taken for this visit. BP Readings from Last 3 Encounters:  07/04/22 125/81  06/05/22 138/84  01/24/22 128/86   Wt Readings from Last 3 Encounters:  07/04/22 168 lb (76.2 kg)  01/24/22 167 lb (75.8 kg)  09/20/21 172 lb (78 kg)       Physical Exam  ***  Last depression screening scores    07/04/2022   11:14 AM 01/24/2022    3:17 PM 08/28/2021   11:04 AM  PHQ 2/9 Scores  PHQ - 2 Score 0 0 0  PHQ- 9 Score 5 0 1   Last fall risk screening    07/04/2022   11:14 AM  [place] in the past year? 0  Number falls in past yr: 0  Injury with Fall? 0  Risk for fall due to : No Fall Risks  Follow up Falls evaluation completed   Last Audit-C alcohol use screening    07/04/2022   11:15 AM  Alcohol Use Disorder Test (AUDIT)  1. How often do you have a drink  containing alcohol? 1  2. How many drinks containing alcohol do you have on a typical day when you are drinking? 0  3. How often do you have six or more drinks  on one occasion? 0  AUDIT-C Score 1   A score of 3 or more in women, and 4 or more in men indicates increased risk for alcohol abuse, EXCEPT if all of the points are from question 1   No results found for any visits on 09/03/22.  Assessment & Plan    Routine Health Maintenance and Physical Exam  Exercise Activities and Dietary recommendations  Goals      Exercise 150 minutes per week (moderate activity)        Immunization History  Administered Date(s) Administered   Influenza, Quadrivalent, Recombinant, Inj, Pf 03/30/2018, 03/29/2019   Influenza,inj,Quad PF,6+ Mos 05/23/2015, 07/25/2016, 03/08/2021   Influenza,inj,quad, With Preservative 04/18/2017   Influenza-Unspecified 03/28/2019, 05/08/2020   Tdap 10/15/2017   Zoster Recombinat (Shingrix) 01/16/2021, 04/16/2021    Health Maintenance  Topic Date Due   COVID-19 Vaccine (1) Never done   HIV Screening  Never done   PAP SMEAR-Modifier  03/23/2023   MAMMOGRAM  09/05/2023   DTaP/Tdap/Td (2 - Td or Tdap) 10/16/2027   COLONOSCOPY (Pts 45-36yr Insurance coverage will need to be confirmed)  11/26/2027   INFLUENZA VACCINE  Completed   Hepatitis C Screening  Completed   Zoster Vaccines- Shingrix  Completed   HPV VACCINES  Aged Out    Discussed health benefits of physical activity, and encouraged her to engage in regular exercise appropriate for her age and condition.  ***  No follow-ups on file.     {provider attestation***:1}   ALavon Paganini MD  [phone](phone) [phone](fax)  [place]

## 2022-09-03 ENCOUNTER — Encounter: Payer: Self-pay | Admitting: Family Medicine

## 2022-09-03 ENCOUNTER — Ambulatory Visit (INDEPENDENT_AMBULATORY_CARE_PROVIDER_SITE_OTHER): Payer: 59 | Admitting: Family Medicine

## 2022-09-03 VITALS — BP 130/86 | HR 66 | Temp 98.2°F | Resp 16 | Ht 66.0 in | Wt 172.4 lb

## 2022-09-03 DIAGNOSIS — R0609 Other forms of dyspnea: Secondary | ICD-10-CM | POA: Diagnosis not present

## 2022-09-03 DIAGNOSIS — Z0000 Encounter for general adult medical examination without abnormal findings: Secondary | ICD-10-CM | POA: Diagnosis not present

## 2022-09-03 DIAGNOSIS — R946 Abnormal results of thyroid function studies: Secondary | ICD-10-CM

## 2022-09-03 DIAGNOSIS — E663 Overweight: Secondary | ICD-10-CM | POA: Diagnosis not present

## 2022-09-03 MED ORDER — FLUTICASONE PROPIONATE 50 MCG/ACT NA SUSP: 2.0000 | Freq: Every day | NASAL | 3 refills | Status: DC

## 2022-09-03 MED ORDER — MONTELUKAST SODIUM 10 MG PO TABS: 10.0000 mg | ORAL_TABLET | Freq: Every day | ORAL | 3 refills | Status: DC

## 2022-09-03 NOTE — Assessment & Plan Note (Signed)
Discussed importance of healthy weight management Discussed diet and exercise Patient is very concerned about her weight and inability to lose weight Discussed that she is not a candidate for weight loss medicaitons Will refer to [facility] for further eval

## 2022-09-03 NOTE — Assessment & Plan Note (Signed)
Seems to be improving with more movement, so holding off on Cardiology appt at this time If worsening or develops chest pain, plans to re-make appt with Cardiology

## 2022-09-03 NOTE — Assessment & Plan Note (Signed)
Incidental vague "abnormality" of [facility] carotid dopplers TSH has been normal Will get formal thyroid US Next steps pending [place] results

## 2022-09-05 ENCOUNTER — Other Ambulatory Visit: Payer: 59

## 2022-09-06 ENCOUNTER — Ambulatory Visit
Admission: RE | Admit: 2022-09-06 | Discharge: 2022-09-06 | Disposition: A | Discharge status: Home / Self Care | Payer: 59 | Source: Ambulatory Visit | Attending: Family Medicine | Admitting: Family Medicine

## 2022-09-06 DIAGNOSIS — Z1231 Encounter for screening mammogram for malignant neoplasm of breast: Secondary | ICD-10-CM | POA: Diagnosis not present

## 2022-09-06 DIAGNOSIS — R946 Abnormal results of thyroid function studies: Secondary | ICD-10-CM | POA: Insufficient documentation

## 2022-09-06 DIAGNOSIS — E042 Nontoxic multinodular goiter: Secondary | ICD-10-CM | POA: Diagnosis not present

## 2022-09-06 NOTE — Progress Notes (Signed)
Patient brought in copies of labs done by [facility].

## 2022-09-24 ENCOUNTER — Encounter: Payer: Self-pay | Admitting: Family Medicine

## 2022-09-24 DIAGNOSIS — J309 Allergic rhinitis, unspecified: Secondary | ICD-10-CM | POA: Diagnosis not present

## 2022-09-24 DIAGNOSIS — Z811 Family history of alcohol abuse and dependence: Secondary | ICD-10-CM | POA: Diagnosis not present

## 2022-09-24 DIAGNOSIS — K5900 Constipation, unspecified: Secondary | ICD-10-CM | POA: Diagnosis not present

## 2022-09-24 DIAGNOSIS — Z809 Family history of malignant neoplasm, unspecified: Secondary | ICD-10-CM | POA: Diagnosis not present

## 2022-09-24 DIAGNOSIS — Z87892 Personal history of anaphylaxis: Secondary | ICD-10-CM | POA: Diagnosis not present

## 2022-09-24 DIAGNOSIS — Z888 Allergy status to other drugs, medicaments and biological substances status: Secondary | ICD-10-CM | POA: Diagnosis not present

## 2022-09-24 DIAGNOSIS — Z87440 Personal history of urinary (tract) infections: Secondary | ICD-10-CM | POA: Diagnosis not present

## 2022-09-24 DIAGNOSIS — H04129 Dry eye syndrome of unspecified lacrimal gland: Secondary | ICD-10-CM | POA: Diagnosis not present

## 2022-09-30 ENCOUNTER — Ambulatory Visit (INDEPENDENT_AMBULATORY_CARE_PROVIDER_SITE_OTHER): Payer: 59 | Admitting: Internal Medicine

## 2022-09-30 VITALS — BP 129/86 | HR 62 | Resp 12 | Ht 66.0 in | Wt 169.0 lb

## 2022-09-30 DIAGNOSIS — G4726 Circadian rhythm sleep disorder, shift work type: Secondary | ICD-10-CM | POA: Insufficient documentation

## 2022-09-30 DIAGNOSIS — R0683 Snoring: Secondary | ICD-10-CM | POA: Diagnosis not present

## 2022-09-30 DIAGNOSIS — G2581 Restless legs syndrome: Secondary | ICD-10-CM

## 2022-09-30 NOTE — Progress Notes (Signed)
Sleep Medicine   Office Visit  Patient Name: Joanne PenningCarolyn M Leavens DOB: 09/15/1965 MRN [id number]    Chief Complaint: sleep evaluation  Brief History:  Joanne JonesCarolyn presents with a 13 year history of restless sleep.   Sleep quality is poor. This is noted most nights. The patient's bed partner reports  loud snoring at night. The patient relates the following symptoms: restless sleep, frequent awakenings with difficulty going back to sleep, and loud snoring are also present. The patient goes to sleep at 10 pm and wakes up at 7:30 am.   Sleep quality is same when outside home environment.  Patient has noted restlessness of her legs at night.  The patient  relates no unusual behavior during the night.  The patient reports a history of psychiatric problems. The Epworth Sleepiness Score is 2 out of 24 .  The patient relates  Cardiovascular risk factors include: TIA.  The patient reports getting out of bed frequently when she awakens. She has worked varied shifts for years.     ROS  General: (-) fever, (-) chills, (-) night sweat Nose and Sinuses: (-) nasal stuffiness or itchiness, (-) postnasal drip, (-) nosebleeds, (-) sinus trouble. Mouth and Throat: (-) sore throat, (-) hoarseness. Neck: (-) swollen glands, (-) enlarged thyroid, (-) neck pain. Respiratory: - cough, - shortness of breath, - wheezing. Neurologic: - numbness, - tingling. Psychiatric: + anxiety, - depression Sleep behavior: -sleep paralysis -hypnogogic hallucinations -dream enactment      -vivid dreams -cataplexy -night terrors -sleep walking   Current Medication: Outpatient Encounter Medications as of 09/30/2022  Medication Sig   butalbital-acetaminophen-caffeine (FIORICET, ESGIC) 50-325-40 MG tablet Take 1 tablet by mouth 2 (two) times daily as needed for headache (Every 6 hours as needed for headache).   fluticasone (FLONASE) 50 MCG/ACT nasal spray Place 2 sprays into both nostrils daily.   montelukast (SINGULAIR) 10 MG tablet  Take 1 tablet (10 mg total) by mouth at bedtime.   OVER THE COUNTER MEDICATION Take 2 tablets by mouth daily. Fresh fruit & vegetable vitamin   No facility-administered encounter medications on file as of 09/30/2022.    Surgical History: Past Surgical History:  Procedure Laterality Date   ABDOMINAL HYSTERECTOMY     still has cervix   APPENDECTOMY     COLONOSCOPY WITH PROPOFOL N/A 11/25/2017   Procedure: COLONOSCOPY WITH PROPOFOL;  Surgeon: Midge MiniumWohl, Darren, MD;  Location: [facility] ENDOSCOPY;  Service: Endoscopy;  Laterality: N/A;   EYE SURGERY Bilateral    03/25/2017 and 04/15/2017 "new lenses put in my eyes"   ROTATOR CUFF REPAIR     TONSILLECTOMY AND ADENOIDECTOMY  1972    Medical History: 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(HCC)     Family History: Non contributory to the present illness  Social History: Social History   Socioeconomic History   Marital status: Married    Spouse name: Not on file   Number of children: 2   Years of education: Not on file   Highest education level: Not on file  Occupational History   Not on file  Tobacco Use   Smoking status: Never   Smokeless tobacco: Never  Vaping Use   Vaping Use: Never used  Substance and Sexual Activity   Alcohol use: Yes    Comment: ocassional  Drug use: No   Sexual activity: Yes    Birth control/protection: Surgical  Other Topics Concern   Not on file  Social History Narrative   Not on file   Social Determinants of Health   Financial Resource Strain: Not on file  Food Insecurity: Not on file  Transportation Needs: Not on file  Physical Activity: Not on file  Stress: Not on file  Social Connections: Not on file  Intimate Partner Violence: Not on file    Vital Signs: Blood pressure 129/86, pulse 62, resp. rate 12, height 5\' 6"  (1.676 m), weight  169 lb (76.7 kg), SpO2 94 %. Body mass index is 27.28 kg/m.   Examination: General Appearance: The patient is well-developed, well-nourished, and in no distress. Neck Circumference: 36 cm Skin: Gross inspection of skin unremarkable. Head: normocephalic, no gross deformities. Eyes: no gross deformities noted. ENT: ears appear grossly normal Neurologic: Alert and oriented. No involuntary movements.    STOP BANG RISK ASSESSMENT S (snore) Have you been told that you snore?     YES   T (tired) Are you often tired, fatigued, or sleepy during the day?   NO  O (obstruction) Do you stop breathing, choke, or gasp during sleep? YES   P (pressure) Do you have or are you being treated for high blood pressure? NO   B (BMI) Is your body index greater than 35 kg/m? NO   A (age) Are you 56 years old or older? YES   N (neck) Do you have a neck circumference greater than 16 inches?   NO   G (gender) Are you a female? NO   TOTAL STOP/BANG "YES" ANSWERS 3                                                               A STOP-Bang score of 2 or less is considered low risk, and a score of 5 or more is high risk for having either moderate or severe OSA. For people who score 3 or 4, doctors may need to perform further assessment to determine how likely they are to have OSA.         EPWORTH SLEEPINESS SCALE:  Scale:  (0)= no chance of dozing; (1)= slight chance of dozing; (2)= moderate chance of dozing; (3)= high chance of dozing  Chance  Situtation    Sitting and reading: 1    Watching TV: 1    Sitting Inactive in public: 0    As a passenger in car: 0      Lying down to rest: 0    Sitting and talking: 0    Sitting quielty after lunch: 0    In a car, stopped in traffic: 0   TOTAL SCORE:   2 out of 24    SLEEP STUDIES:  No prior sleep study   LABS: Recent Results (from the past 2160 hour(s))  TSH     Status: None   Collection Time: 07/04/22 12:00 AM  Result Value Ref  Range   TSH 1.570 0.450 - 4.500 uIU/mL  CBC with Differential/Platelet     Status: None   Collection Time: 07/04/22 12:00 AM  Result Value Ref Range   WBC 7.2 3.4 - 10.8 x10E3/uL   RBC 5.04 3.77 - 5.28 x10E6/uL  Hemoglobin 14.4 11.1 - 15.9 g/dL   Hematocrit 16.1 09.6 - 46.6 %   MCV 85 79 - 97 fL   MCH 28.6 26.6 - 33.0 pg   MCHC 33.5 31.5 - 35.7 g/dL   RDW 04.5 40.9 - 81.1 %   Platelets 300 150 - 450 x10E3/uL   Neutrophils 56 Not Estab. %   Lymphs 35 Not Estab. %   Monocytes 6 Not Estab. %   Eos 2 Not Estab. %   Basos 1 Not Estab. %   Neutrophils Absolute 4.1 1.4 - 7.0 x10E3/uL   Lymphocytes Absolute 2.5 0.7 - 3.1 x10E3/uL   Monocytes Absolute 0.5 0.1 - 0.9 x10E3/uL   EOS (ABSOLUTE) 0.1 0.0 - 0.4 x10E3/uL   Basophils Absolute 0.1 0.0 - 0.2 x10E3/uL   Immature Granulocytes 0 Not Estab. %   Immature Grans (Abs) 0.0 0.0 - 0.1 x10E3/uL  Comprehensive metabolic panel     Status: Abnormal   Collection Time: 07/04/22 12:00 AM  Result Value Ref Range   Glucose 98 70 - 99 mg/dL   BUN 17 6 - 24 mg/dL   Creatinine, Ser 9.14 (H) 0.57 - 1.00 mg/dL   eGFR 64 >78 GN/FAO/1.30   BUN/Creatinine Ratio 17 9 - 23   Sodium 140 134 - 144 mmol/L   Potassium 4.1 3.5 - 5.2 mmol/L   Chloride 101 96 - 106 mmol/L   CO2 24 20 - 29 mmol/L   Calcium 9.6 8.7 - 10.2 mg/dL   Total Protein 7.2 6.0 - 8.5 g/dL   Albumin 4.6 3.8 - 4.9 g/dL   Globulin, Total 2.6 1.5 - 4.5 g/dL   Albumin/Globulin Ratio 1.8 1.2 - 2.2   Bilirubin Total <0.2 0.0 - 1.2 mg/dL   Alkaline Phosphatase 87 44 - 121 IU/L   AST 22 0 - 40 IU/L   ALT 25 0 - 32 IU/L  Vitamin B12     Status: None   Collection Time: 07/04/22 12:00 AM  Result Value Ref Range   Vitamin B-12 445 232 - 1,245 pg/mL  VITAMIN D 25 Hydroxy (Vit-D Deficiency, Fractures)     Status: Abnormal   Collection Time: 07/04/22 12:00 AM  Result Value Ref Range   Vit D, 25-Hydroxy 27.7 (L) 30.0 - 100.0 ng/mL    Comment: Vitamin D deficiency has been defined by the  Institute of Medicine and an Endocrine Society practice guideline as a level of serum 25-OH vitamin D less than 20 ng/mL (1,2). The Endocrine Society went on to further define vitamin D insufficiency as a level between 21 and 29 ng/mL (2). 1. IOM (Institute of Medicine). 2010. Dietary reference    intakes for calcium and D. [place]: The    [employer]. 2. Holick MF, [place], Bischoff-Ferrari HA, et al.    Evaluation, treatment, and prevention of vitamin D    deficiency: an Endocrine Society clinical practice    guideline. JCEM. 2011 Jul; 96(7):1911-30.   Specimen status report     Status: None   Collection Time: 07/04/22 12:00 AM  Result Value Ref Range   specimen status report Comment     Comment: Please note Please note The date and/or time of collection was not indicated on the requisition as required by state and federal law.  The date of receipt of the specimen was used as the collection date if not supplied.   Basic metabolic panel     Status: None   Collection Time: 07/30/22 12:00 AM  Result Value  Ref Range   Glucose 106   Lipid panel     Status: Abnormal   Collection Time: 07/30/22 12:00 AM  Result Value Ref Range   Triglycerides 125 40 - 160   Cholesterol 251 (A) 0 - 200   HDL 64 35 - 70   LDL Cholesterol 162     Radiology: MM 3D SCREEN BREAST BILATERAL  Result Date: 09/09/2022 CLINICAL DATA:  Screening. EXAM: DIGITAL SCREENING BILATERAL MAMMOGRAM WITH TOMOSYNTHESIS AND CAD TECHNIQUE: Bilateral screening digital craniocaudal and mediolateral oblique mammograms were obtained. Bilateral screening digital breast tomosynthesis was performed. The images were evaluated with computer-aided detection. COMPARISON:  Previous exam(s). ACR Breast Density Category b: There are scattered areas of fibroglandular density. FINDINGS: There are no findings suspicious for malignancy. IMPRESSION: No mammographic evidence of malignancy. A result letter of this  screening mammogram will be mailed directly to the patient. RECOMMENDATION: Screening mammogram in one year. (Code:SM-B-01Y) BI-RADS CATEGORY  1: Negative. Electronically Signed   By: Sherian Rein M.D.   On: 09/09/2022 14:15   US THYROID  Result Date: 09/06/2022 CLINICAL DATA:  Abnormal lifeline screening EXAM: THYROID ULTRASOUND TECHNIQUE: Ultrasound examination of the thyroid gland and adjacent soft tissues was performed. COMPARISON:  None available FINDINGS: Parenchymal Echotexture: Mildly heterogenous Isthmus: 0.2 cm Right lobe: 4.1 x 1.8 x 1.3 cm Left lobe: 4.4 x 1.6 x 1.3 cm _________________________________________________________ Estimated total number of nodules >/= 1 cm: 2 Number of spongiform nodules >/=  2 cm not described below (TR1): 0 Number of mixed cystic and solid nodules >/= 1.5 cm not described below (TR2): 0 _________________________________________________________ Multiple subcentimeter right thyroid nodules do not meet criteria for FNA or imaging follow-up. Nodule 4: 1.5 x 0.9 x 0.9 cm spongiform left inferior thyroid nodule does not meet criteria for imaging surveillance or FNA. Nodule 5: 1.3 x 0.8 x 1.0 cm predominantly cystic left inferior thyroid nodule does not meet criteria for imaging surveillance or FNA. IMPRESSION: Multiple bilateral thyroid nodules, which do not meet criteria for FNA or imaging follow-up. The above is in keeping with the ACR TI-RADS recommendations - J Am Coll Radiol 2017;14:587-595. Electronically Signed   By: Acquanetta Belling M.D.   On: 09/06/2022 10:26    No results found.  MM 3D SCREEN BREAST BILATERAL  Result Date: 09/09/2022 CLINICAL DATA:  Screening. EXAM: DIGITAL SCREENING BILATERAL MAMMOGRAM WITH TOMOSYNTHESIS AND CAD TECHNIQUE: Bilateral screening digital craniocaudal and mediolateral oblique mammograms were obtained. Bilateral screening digital breast tomosynthesis was performed. The images were evaluated with computer-aided detection. COMPARISON:   Previous exam(s). ACR Breast Density Category b: There are scattered areas of fibroglandular density. FINDINGS: There are no findings suspicious for malignancy. IMPRESSION: No mammographic evidence of malignancy. A result letter of this screening mammogram will be mailed directly to the patient. RECOMMENDATION: Screening mammogram in one year. (Code:SM-B-01Y) BI-RADS CATEGORY  1: Negative. Electronically Signed   By: Sherian Rein M.D.   On: 09/09/2022 14:15   US THYROID  Result Date: 09/06/2022 CLINICAL DATA:  Abnormal lifeline screening EXAM: THYROID ULTRASOUND TECHNIQUE: Ultrasound examination of the thyroid gland and adjacent soft tissues was performed. COMPARISON:  None available FINDINGS: Parenchymal Echotexture: Mildly heterogenous Isthmus: 0.2 cm Right lobe: 4.1 x 1.8 x 1.3 cm Left lobe: 4.4 x 1.6 x 1.3 cm _________________________________________________________ Estimated total number of nodules >/= 1 cm: 2 Number of spongiform nodules >/=  2 cm not described below (TR1): 0 Number of mixed cystic and solid nodules >/= 1.5 cm not described below (TR2): 0  _________________________________________________________ Multiple subcentimeter right thyroid nodules do not meet criteria for FNA or imaging follow-up. Nodule 4: 1.5 x 0.9 x 0.9 cm spongiform left inferior thyroid nodule does not meet criteria for imaging surveillance or FNA. Nodule 5: 1.3 x 0.8 x 1.0 cm predominantly cystic left inferior thyroid nodule does not meet criteria for imaging surveillance or FNA. IMPRESSION: Multiple bilateral thyroid nodules, which do not meet criteria for FNA or imaging follow-up. The above is in keeping with the ACR TI-RADS recommendations - J Am Coll Radiol 2017;14:587-595. Electronically Signed   By: Acquanetta Belling M.D.   On: 09/06/2022 10:26      Assessment and Plan: Patient Active Problem List   Diagnosis Date Noted   Snoring 09/30/2022   Abnormal thyroid scan 09/03/2022   DOE (dyspnea on exertion) 07/04/2022    Pre-syncope 07/04/2022   Ear pain, bilateral 01/24/2022   Polyp of sigmoid colon 10/01/2021   Palpitations 08/28/2021   Vision changes 03/08/2021   Migraine 09/08/2018   Chronic fatigue 05/21/2017   Insomnia 05/21/2017   Overweight 05/20/2017   Restless leg syndrome 05/20/2017   History of kidney stones 06/11/2016   Anxiety 03/10/2015   Airway hyperreactivity 03/10/2015   CN (constipation) 03/10/2015   Blood in the urine 03/10/2015   Personal history of disorder of nervous system and sense organs 03/10/2015   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 Restless leg syndrome Intermittent. Will assess with PSG  2. Snoring Pt reports severe. PLAN   Patient evaluation suggests high risk of sleep disordered breathing due to snoring, Mallampati III airway, frequent awakenings,  daytime sleepiness. Suggest: PSG to assess/treat the patient's sleep disordered breathing. The patient was also counselled on weight loss to optimize sleep health.   3. Shift work sleep disorder Discussed sleep hygiene, recommended white noise, active thinking.  Methods to improve your sleep habits -Keep a consistent wind down period before your bedtime to help you relax. Do not do work-related activities around bedtime.  -Go to bed only when you are sleepy.  -Get out of bed at the same time every morning, even on holidays or weekends.  -If you haven't fallen asleep after lying in bed in 20 minutes, do something boring and relaxing with minimal light expo sure outside the bedroom. Return to bed when you are sleepy.  -While in bed, turn the clock away from you. It'll only remind you of something you probably don't want to know.  -No napping during the day, especially after 3 pm. If a nap is unavoidable, keep it under an hour.  -Your  bedroom should be quiet and dark.  -Take a warm, relaxing bath or shower! A warm bath or shower prior to bedtime increasesyour body's temperature so the subsequent cooling off period can help induce sleep.  -Exercise regularly, but not tough exercise within 6 hours or bedtime.  -Avoid eating large or fatty meals near bedtime.  -Avoid caffeine after lunch.    1 - Portions taken from http://[email or web address].fr.aspx     General Counseling: I have discussed the findings of the evaluation and examination with Joanne Wade.  I have also discussed any further diagnostic evaluation thatmay be needed or ordered today. Tomorrow verbalizes understanding of the findings of todays visit. We also reviewed her medications today and discussed drug interactions and side effects including but not limited excessive drowsiness and  altered mental states. We also discussed that there is always a risk not just to her but also people around her. she has been encouraged to call the office with any questions or concerns that should arise related to todays visit.  No orders of the defined types were placed in this encounter.       I have personally obtained a history, evaluated the patient, evaluated pertinent data, formulated the assessment and plan and placed orders.   This patient was seen today by Emmaline Kluver, PA-C in collaboration with Dr. Freda Munro.   Yevonne Pax, MD [facility] Diplomate ABMS Pulmonary and Critical Care Medicine Sleep medicine

## 2022-10-24 DIAGNOSIS — Z961 Presence of intraocular lens: Secondary | ICD-10-CM | POA: Diagnosis not present

## 2022-11-14 ENCOUNTER — Ambulatory Visit: Payer: Self-pay

## 2022-11-14 NOTE — Telephone Encounter (Addendum)
Pt called back. After further examination of needle by both pt and her husband. They do not think the needle in broken in abdomen. They can see the needle is pushed into barrel if syringe. Also exposed needle has a perfect bevel. Pt will not be going to UC , and wishes to cancel OV for tomorrow. Pt will monitor and call back if needed.   Chief Complaint: Broken needle in pt's abdomen Symptoms: none Frequency: This afternoon Pertinent Negatives: Patient denies pain Disposition: [] ED /[x] Urgent Care (no appt availability in office) / [] Appointment(In office/virtual)/ []  [facility]/ [] Home Care/ [] Refused Recommended Disposition /[] [facility]/ []  Follow-up with PCP Additional Notes: Pt states that the needle broke off in her abdomen this afternoon when giving herself an injection.

## 2022-11-14 NOTE — Telephone Encounter (Signed)
Call from Agent. Agent states that pt was calling to make an appointment to be seen in office. Per agent pt was giving herself and injection and part of the needle broke off in her abdomen. Reason for Disposition . [1] Caller can't get FB out AND [2] causing no pain  (Exception: Tiny, superficial, pain-free FBs; since these don't need to be removed.)  Answer Assessment - Initial Assessment Questions 1. MECHANISM: "How did it happen?"      Needle broke off in abdomen 2. LOCATION: "Where is the FB (e.g., splinter) located?"      Abdomen 3. OBJECT: "What type of FB was it?"      needle 4. DEPTH: "How deep do you think the FB goes?"      Not too deep 5. ONSET: "When did the injury occur?" (Minutes or hours)      2 pm 6. PAIN: "Is it painful?" If Yes, ask: "How bad is the pain?"  (Scale 1-10; or mild, moderate, severe)     no  Protocols used: Skin Foreign Body-A-AH

## 2022-11-15 ENCOUNTER — Ambulatory Visit: Payer: 59 | Admitting: Physician Assistant

## 2022-12-04 DIAGNOSIS — L821 Other seborrheic keratosis: Secondary | ICD-10-CM | POA: Diagnosis not present

## 2022-12-04 DIAGNOSIS — D2272 Melanocytic nevi of left lower limb, including hip: Secondary | ICD-10-CM | POA: Diagnosis not present

## 2022-12-04 DIAGNOSIS — D2261 Melanocytic nevi of right upper limb, including shoulder: Secondary | ICD-10-CM | POA: Diagnosis not present

## 2022-12-04 DIAGNOSIS — D2271 Melanocytic nevi of right lower limb, including hip: Secondary | ICD-10-CM | POA: Diagnosis not present

## 2022-12-04 DIAGNOSIS — D2262 Melanocytic nevi of left upper limb, including shoulder: Secondary | ICD-10-CM | POA: Diagnosis not present

## 2022-12-04 DIAGNOSIS — D225 Melanocytic nevi of trunk: Secondary | ICD-10-CM | POA: Diagnosis not present

## 2022-12-04 DIAGNOSIS — L538 Other specified erythematous conditions: Secondary | ICD-10-CM | POA: Diagnosis not present

## 2022-12-04 DIAGNOSIS — L820 Inflamed seborrheic keratosis: Secondary | ICD-10-CM | POA: Diagnosis not present

## 2023-03-06 ENCOUNTER — Ambulatory Visit: Payer: 59 | Admitting: Family Medicine

## 2023-03-18 DIAGNOSIS — L03031 Cellulitis of right toe: Secondary | ICD-10-CM | POA: Diagnosis not present

## 2023-03-18 DIAGNOSIS — L03032 Cellulitis of left toe: Secondary | ICD-10-CM | POA: Diagnosis not present

## 2023-03-26 DIAGNOSIS — M25562 Pain in left knee: Secondary | ICD-10-CM | POA: Diagnosis not present

## 2023-03-26 DIAGNOSIS — M222X2 Patellofemoral disorders, left knee: Secondary | ICD-10-CM | POA: Diagnosis not present

## 2023-03-26 DIAGNOSIS — M25561 Pain in right knee: Secondary | ICD-10-CM | POA: Diagnosis not present

## 2023-03-26 DIAGNOSIS — M222X1 Patellofemoral disorders, right knee: Secondary | ICD-10-CM | POA: Diagnosis not present

## 2023-03-31 DIAGNOSIS — M25561 Pain in right knee: Secondary | ICD-10-CM | POA: Diagnosis not present

## 2023-03-31 DIAGNOSIS — M25562 Pain in left knee: Secondary | ICD-10-CM | POA: Diagnosis not present

## 2023-04-01 DIAGNOSIS — L03031 Cellulitis of right toe: Secondary | ICD-10-CM | POA: Diagnosis not present

## 2023-04-01 DIAGNOSIS — L03032 Cellulitis of left toe: Secondary | ICD-10-CM | POA: Diagnosis not present

## 2023-04-03 ENCOUNTER — Encounter: Payer: Self-pay | Admitting: Family Medicine

## 2023-04-03 DIAGNOSIS — R0609 Other forms of dyspnea: Secondary | ICD-10-CM

## 2023-04-03 NOTE — Telephone Encounter (Signed)
Ok to re-place Cardiology referral for DOE (on prob list) to [facility]. Thanks!

## 2023-04-04 DIAGNOSIS — M25562 Pain in left knee: Secondary | ICD-10-CM | POA: Diagnosis not present

## 2023-04-04 DIAGNOSIS — M25561 Pain in right knee: Secondary | ICD-10-CM | POA: Diagnosis not present

## 2023-06-04 IMAGING — MG MM DIGITAL SCREENING BILAT W/ TOMO AND CAD
6 of 10 series · 6 of 30 positions shown · non-contrast
Comparison: Previous exam(s).

CLINICAL DATA: Screening.

EXAM:
DIGITAL SCREENING BILATERAL MAMMOGRAM WITH TOMOSYNTHESIS AND CAD
TECHNIQUE: Bilateral screening digital craniocaudal and mediolateral oblique
mammograms were obtained. Bilateral screening digital breast
tomosynthesis was performed. The images were evaluated with
computer-aided detection.

[R CC synth-2D]
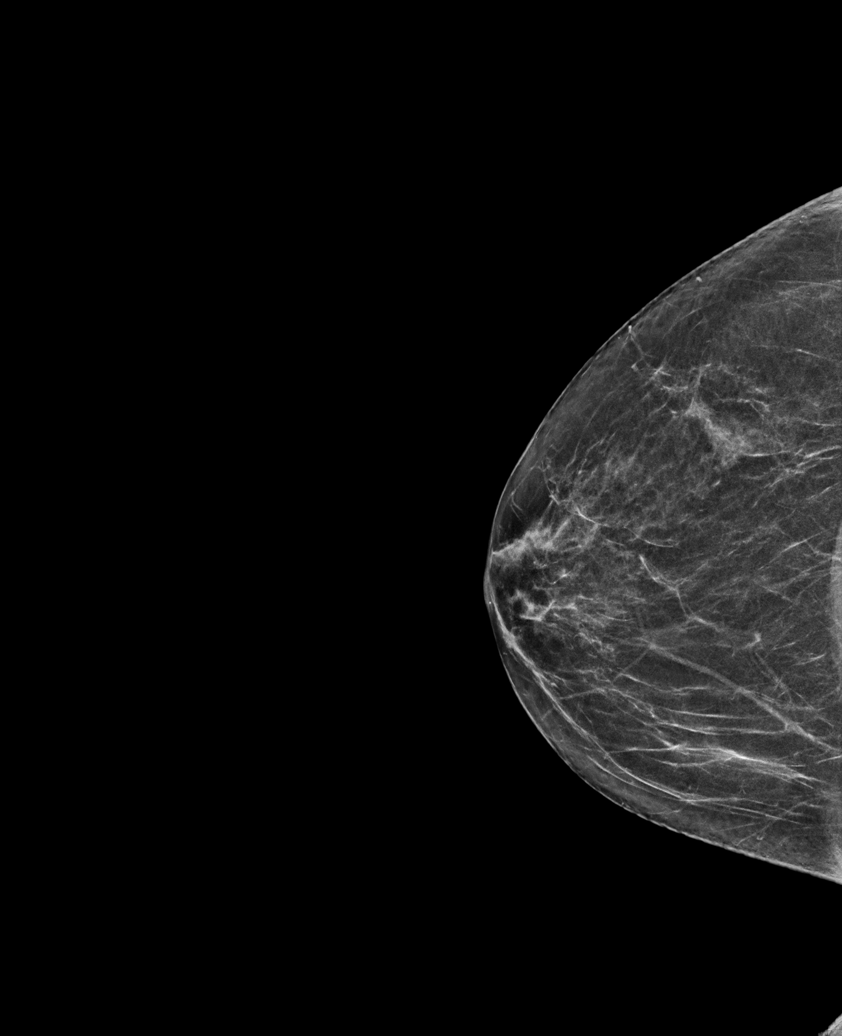

[L CC synth-2D]
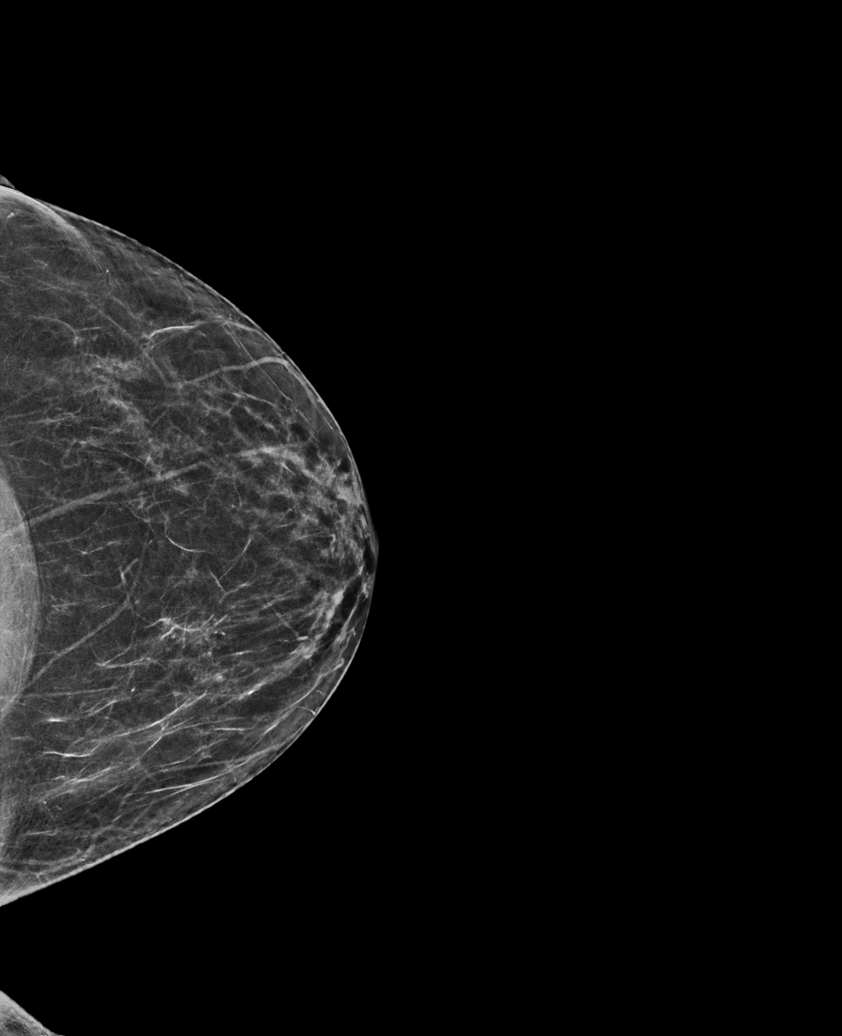

[L MLO synth-2D (1 of 2)]
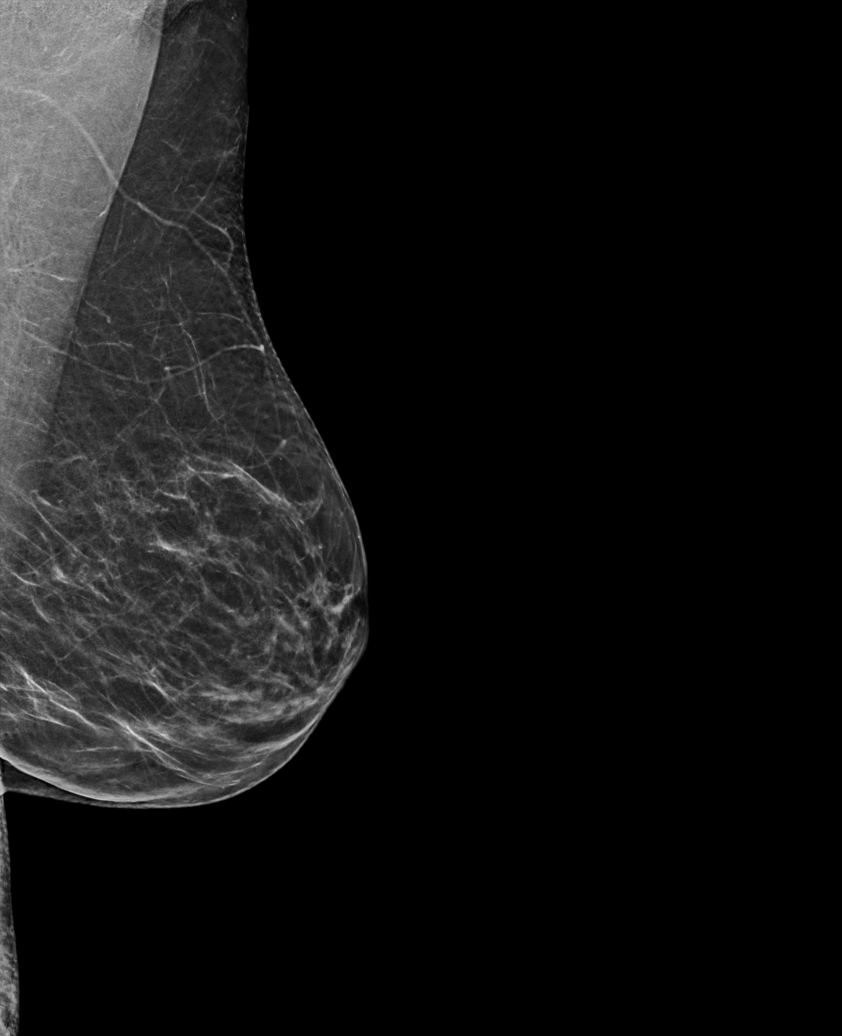

[R MLO synth-2D]
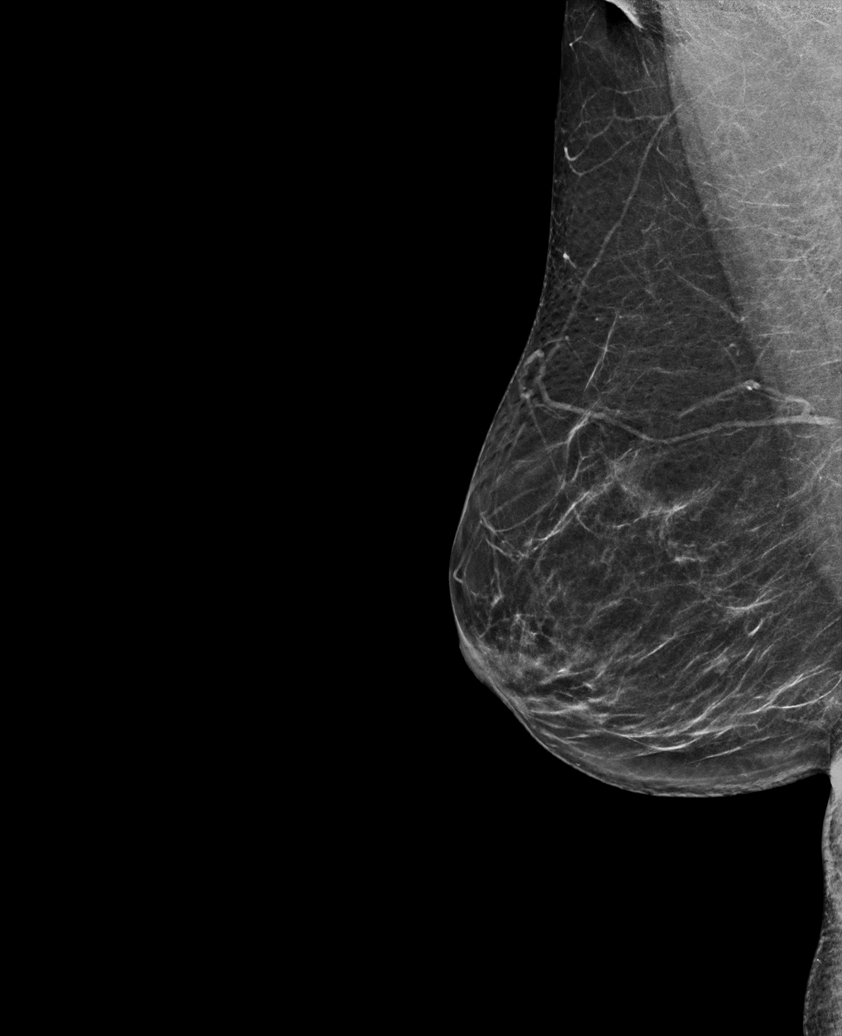

[L MLO synth-2D (2 of 2)]
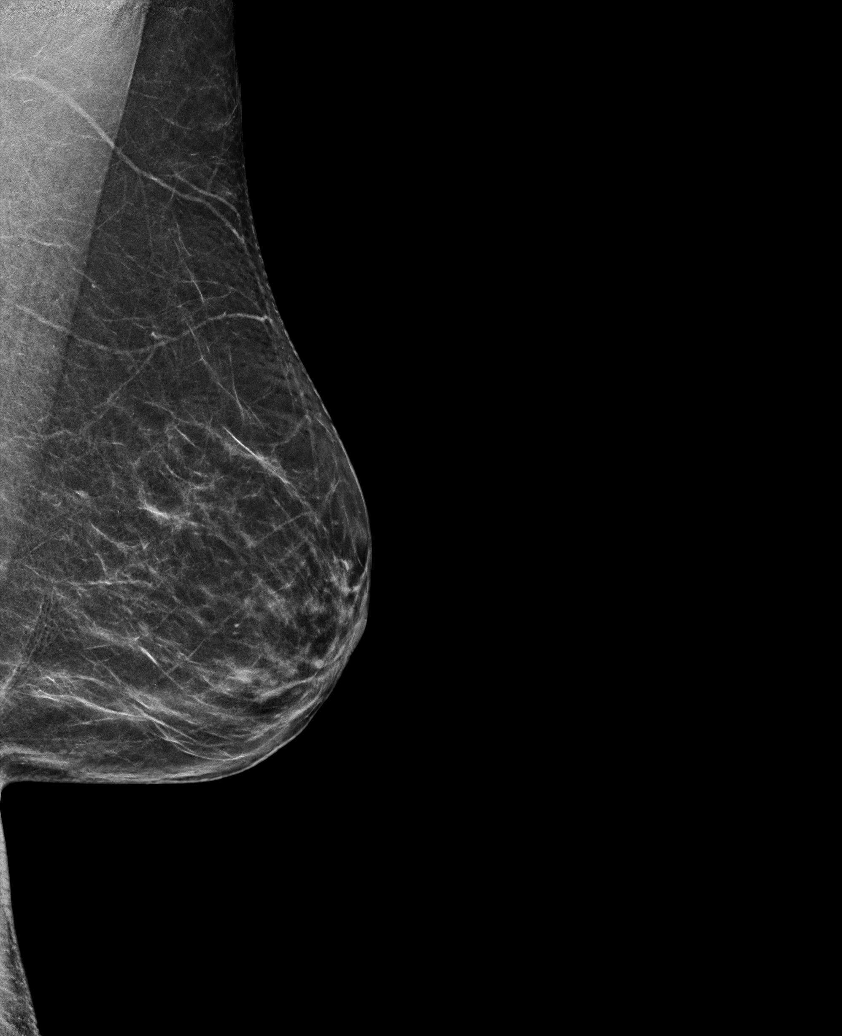

[R MLO tomo · tomo slice 29/58.0]
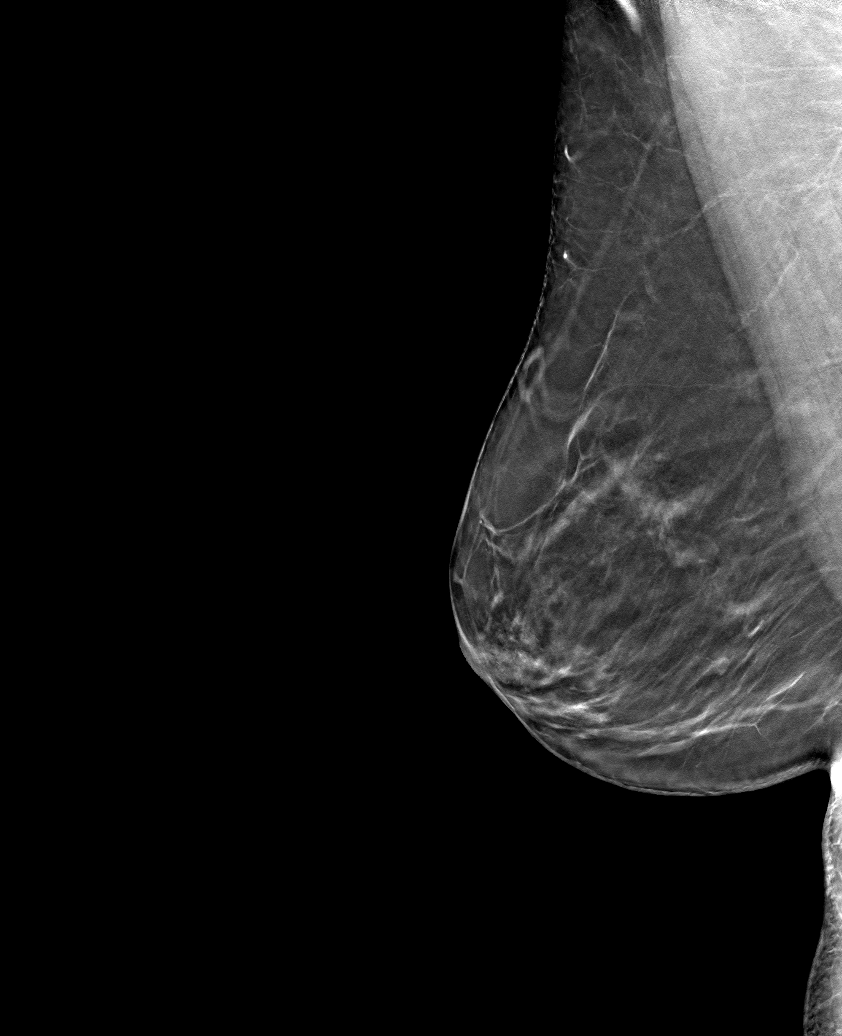

[6 of 30 positions shown; findings below may reference images not displayed]

ACR Breast Density Category b: There are scattered areas of
fibroglandular density.
FINDINGS: There are no findings suspicious for malignancy.
IMPRESSION: No mammographic evidence of malignancy. A result letter of this
screening mammogram will be mailed directly to the patient.

RECOMMENDATION:
Screening mammogram in one year. (Code:51-O-LD2)

BI-RADS CATEGORY  1: Negative.

## 2023-06-12 ENCOUNTER — Ambulatory Visit: Payer: 59 | Attending: Cardiology | Admitting: Cardiology

## 2023-06-12 ENCOUNTER — Encounter: Payer: Self-pay | Admitting: Cardiology

## 2023-06-12 VITALS — BP 134/86 | HR 70 | Ht 66.0 in | Wt 165.2 lb

## 2023-06-12 DIAGNOSIS — E7800 Pure hypercholesterolemia, unspecified: Secondary | ICD-10-CM

## 2023-06-12 DIAGNOSIS — R0609 Other forms of dyspnea: Secondary | ICD-10-CM

## 2023-06-12 MED ORDER — ALPRAZOLAM 0.5 MG PO TABS: 0.5000 mg | ORAL_TABLET | Freq: Once | ORAL | 0 refills | Status: AC

## 2023-06-12 MED ORDER — IVABRADINE HCL 5 MG PO TABS: 15.0000 mg | ORAL_TABLET | Freq: Once | ORAL | 0 refills | Status: AC

## 2023-06-12 MED ORDER — METOPROLOL TARTRATE 100 MG PO TABS: 100.0000 mg | ORAL_TABLET | Freq: Once | ORAL | 0 refills | Status: DC

## 2023-06-12 NOTE — Patient Instructions (Signed)
Medication Instructions:   Your physician recommends that you continue on your current medications as directed. Please refer to the Current Medication list given to you today.  *If you need a refill on your cardiac medications before your next appointment, please call your pharmacy*   Lab Work:  Your physician recommends you have labs - BMP   If you have labs (blood work) drawn today and your tests are completely normal, you will receive your results only by: MyChart Message (if you have MyChart) OR A paper copy in the mail If you have any lab test that is abnormal or we need to change your treatment, we will call you to review the results.   Testing/Procedures:  Your physician has requested that you have an echocardiogram. Echocardiography is a painless test that uses sound waves to create images of your heart. It provides your doctor with information about the size and shape of your heart and how well your heart's chambers and valves are working. This procedure takes approximately one hour. There are no restrictions for this procedure. Please do NOT wear cologne, perfume, aftershave, or lotions (deodorant is allowed). Please arrive 15 minutes prior to your appointment time.  Please note: We ask at that you not bring children with you during ultrasound (echo/ vascular) testing. Due to room size and safety concerns, children are not allowed in the ultrasound rooms during exams. Our front office staff cannot provide observation of children in our lobby area while testing is being conducted. An adult accompanying a patient to their appointment will only be allowed in the ultrasound room at the discretion of the ultrasound technician under special circumstances. We apologize for any inconvenience.    Your cardiac CT will be scheduled at one of the below locations:   [facility] [address] [place], [place] [address] [phone]  If scheduled at [facility], please arrive 15 mins early for check-in and test prep.  There is spacious parking and easy access to the radiology department from the [facility] Heart and Vascular entrance. Please enter here and check-in with the desk attendant.   Please follow these instructions carefully (unless otherwise directed):  An IV will be required for this test and Nitroglycerin will be given.  Hold all erectile dysfunction medications at least 3 days (72 hrs) prior to test. (Ie viagra, cialis, sildenafil, tadalafil, etc)   On the Night Before the Test: Be sure to Drink plenty of water. Do not consume any caffeinated/decaffeinated beverages or chocolate 12 hours prior to your test. Do not take any antihistamines 12 hours prior to your test.  On the Day of the Test: Drink plenty of water until 1 hour prior to the test. Do not eat any food 1 hour prior to test. You may take your regular medications prior to the test.  Take metoprolol (Lopressor) two hours prior to test. Take Corlanor 2 hours prior to cardiac ct Take Xanax 0.5 mg 30 mins prior to Cardiac CT Patients who wear a continuous glucose monitor MUST remove the device prior to scanning. FEMALES- please wear underwire-free bra if available, avoid dresses & tight clothing      After the Test: Drink plenty of water. After receiving IV contrast, you may experience a mild flushed feeling. This is normal. On occasion, you may experience a mild rash up to 24 hours after the test. This is not dangerous. If this occurs, you can take Benadryl 25 mg and increase your fluid intake.  If you experience trouble breathing, this can be serious. If it is severe call 911 IMMEDIATELY. If it is mild, please call our office.  We will call to schedule your test 2-4 weeks out understanding that some insurance companies will need an authorization prior to the service being performed.   For more information and frequently asked questions, please visit our  website : [email or web address]  For non-scheduling related questions, please contact the cardiac imaging nurse navigator should you have any questions/concerns: Cardiac Imaging Nurse Navigators Direct Office Dial: [phone]   For scheduling needs, including cancellations and rescheduling, please call [place], [phone].   Follow-Up: At [facility], you and your health needs are our priority.  As part of our continuing mission to provide you with exceptional heart care, we have created designated Provider Care Teams.  These Care Teams include your primary Cardiologist (physician) and Advanced Practice Providers (APPs -  Physician Assistants and Nurse Practitioners) who all work together to provide you with the care you need, when you need it.  We recommend signing up for the patient portal called "MyChart".  Sign up information is provided on this After Visit Summary.  MyChart is used to connect with patients for Virtual Visits (Telemedicine).  Patients are able to view lab/test results, encounter notes, upcoming appointments, etc.  Non-urgent messages can be sent to your provider as well.   To learn more about what you can do with MyChart, go to [email or web address].    Your next appointment:   2 month(s)  Provider:   You may see Debbe Odea, MD or one of the following Advanced Practice Providers on your designated Care Team:   Nicolasa Ducking, NP Eula Listen, PA-C Cadence Fransico Michael, PA-C Charlsie Quest, NP Carlos Levering, NP

## 2023-06-12 NOTE — Progress Notes (Signed)
Cardiology Office Note:    Date:  06/12/2023   ID:  Joanne Wade, DOB 16-Aug-1965, MRN [id number]  PCP:  Erasmo Downer, MD   [facility] Providers Cardiologist:  Debbe Odea, MD     Referring MD: Erasmo Downer, MD   Chief Complaint  Patient presents with   New Patient (Initial Visit)    Referred for cardiac evaluation of dyspnea on exertion and dizziness.  No prior cardiac history per the patient. Holter monitor results from 09/2021 available in chart.  Patient reports intermittent feeling heart fluttering feeling while lying in bed at night with last episode 2 weeks ago.  No further dizziness since referral was placed.      Joanne Wade is a 57 y.o. female who is being seen today for the evaluation of dyspnea at the request of Bacigalupo, Marzella Schlein, MD.   History of Present Illness:    Joanne Wade is a 57 y.o. female with a hx of hyperlipidemia who presents due to dyspnea on exertion.  States having dyspnea on exertion over 2 to 42-month in the summer.  Denies chest pain.  Has been exercising more, feels like her symptoms have improved with weight loss.  Still worried about her health, wants to make sure her heart is okay.  Denies any immediate family history of heart disease or heart attacks.  Endorses being claustrophobic.  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[facility])     Past Surgical History:  Procedure Laterality Date   ABDOMINAL HYSTERECTOMY     still has cervix   APPENDECTOMY     COLONOSCOPY WITH PROPOFOL N/A 11/25/2017   Procedure: COLONOSCOPY WITH PROPOFOL;  Surgeon: Midge Minium, MD;  Location: [facility] ENDOSCOPY;  Service: Endoscopy;  Laterality: N/A;   EYE SURGERY Bilateral    03/25/2017 and 04/15/2017 "new lenses put in my eyes"   ROTATOR CUFF REPAIR     TONSILLECTOMY  AND ADENOIDECTOMY  1972    Current Medications: Current Meds  Medication Sig   ALPRAZolam (XANAX) 0.5 MG tablet Take 1 tablet (0.5 mg total) by mouth once for 1 dose. TAKE 30 MINS PRIOR TO CARDIAC CT   butalbital-acetaminophen-caffeine (FIORICET, ESGIC) 50-325-40 MG tablet Take 1 tablet by mouth 2 (two) times daily as needed for headache (Every 6 hours as needed for headache).   fluticasone (FLONASE) 50 MCG/ACT nasal spray Place 2 sprays into both nostrils daily.   ivabradine (CORLANOR) 5 MG TABS tablet Take 3 tablets (15 mg total) by mouth once for 1 dose. TAKE TWO HOURS PRIOR TO CARDIAC CT   metoprolol tartrate (LOPRESSOR) 100 MG tablet Take 1 tablet (100 mg total) by mouth once for 1 dose. TAKE TWO HOURS PRIOR TO CARDIAC CT   MIEBO 1.338 GM/ML SOLN Apply to eye.   montelukast (SINGULAIR) 10 MG tablet Take 1 tablet (10 mg total) by mouth at bedtime.   OVER THE COUNTER MEDICATION Take 2 tablets by mouth daily. Fresh fruit & vegetable vitamin   SEMAGLUTIDE-WEIGHT MANAGEMENT [place] Inject 40 Units into the skin once a week.   VEVYE 0.1 % SOLN Apply to eye.     Allergies:   Morphine, Belladonna alkaloids, Lexapro [escitalopram oxalate], Morphine and codeine, and Tramadol   Social History   Socioeconomic History   Marital status:  Married    Spouse name: Not on file   Number of children: 2   Years of education: Not on file   Highest education level: Not on file  Occupational History   Not on file  Tobacco Use   Smoking status: Never   Smokeless tobacco: Never  Vaping Use   Vaping status: Never Used  Substance and Sexual Activity   Alcohol use: Yes    Comment: ocassional   Drug use: No   Sexual activity: Yes    Birth control/protection: Surgical  Other Topics Concern   Not on file  Social History Narrative   Not on file   Social Drivers of Health   Financial Resource Strain: Not on file  Food Insecurity: Not on file  Transportation Needs: Not on file  Physical Activity: Not  on file  Stress: Not on file  Social Connections: Unknown (11/05/2021)   [facility]   Social Network    Social Network: Not on file     Family History: The patient's family history includes Alcohol abuse in her mother; Cancer in her father and mother; Colon cancer in her paternal grandfather; Heart disease in her maternal grandmother; Hypertension in her mother and paternal uncle; Melanoma in her mother. There is no history of Breast cancer.  ROS:   Please see the history of present illness.     All other systems reviewed and are negative.  EKGs/Labs/Other Studies Reviewed:    The following studies were reviewed today:  EKG Interpretation Date/Time:  Thursday June 12 2023 12:12:22 EST Ventricular Rate:  70 PR Interval:  130 QRS Duration:  86 QT Interval:  386 QTC Calculation: 416 R Axis:   52  Text Interpretation: Normal sinus rhythm Normal ECG Confirmed by Debbe Odea ([id number]) on 06/12/2023 12:23:35 PM    Recent Labs: 07/04/2022: ALT 25; BUN 17; Creatinine, Ser 1.03; Hemoglobin 14.4; Platelets 300; Potassium 4.1; Sodium 140; TSH 1.570  Recent Lipid Panel    Component Value Date/Time   CHOL 251 (A) 07/30/2022 0000   CHOL 215 (H) 11/14/2021 1005   TRIG 125 07/30/2022 0000   HDL 64 07/30/2022 0000   HDL 50 11/14/2021 1005   CHOLHDL 4.3 11/14/2021 1005   CHOLHDL 4.2 05/20/2017 0915   LDLCALC 162 07/30/2022 0000   LDLCALC 152 (H) 11/14/2021 1005   LDLCALC 146 (H) 05/20/2017 0915     Risk Assessment/Calculations:         Physical Exam:    VS:  BP 134/86 (BP Location: Left Arm, Patient Position: Sitting, Cuff Size: Normal)   Pulse 70   Ht 5\' 6"  (1.676 m)   Wt 165 lb 3.2 oz (74.9 kg)   SpO2 98%   BMI 26.66 kg/m     Wt Readings from Last 3 Encounters:  06/12/23 165 lb 3.2 oz (74.9 kg)  09/30/22 169 lb (76.7 kg)  09/03/22 172 lb 6.4 oz (78.2 kg)     GEN:  Well nourished, well developed in no acute distress HEENT:  Normal NECK: No JVD; No carotid bruits CARDIAC: RRR, no murmurs, rubs, gallops RESPIRATORY:  Clear to auscultation without rales, wheezing or rhonchi  ABDOMEN: Soft, non-tender, non-distended MUSCULOSKELETAL:  No edema; No deformity  SKIN: Warm and dry NEUROLOGIC:  Alert and oriented x 3 PSYCHIATRIC:  Normal affect   ASSESSMENT:    1. Dyspnea on exertion   2. Pure hypercholesterolemia    PLAN:    In order of problems listed above:  Dyspnea on  exertion, this could be an anginal equivalent.  Get echo, get coronary CTA. Hyperlipidemia, 10-year ASCVD risk 2.4%.  Low-cholesterol diet advised.  Low threshold to start statin.  Coronary CT as above.  Follow-up after cardiac testing.      Medication Adjustments/Labs and Tests Ordered: Current medicines are reviewed at length with the patient today.  Concerns regarding medicines are outlined above.  Orders Placed This Encounter  Procedures   CT CORONARY MORPH W/CTA COR W/SCORE W/CA W/CM &/OR WO/CM   Basic Metabolic Panel (BMET)   EKG 12-Lead   ECHOCARDIOGRAM COMPLETE   Meds ordered this encounter  Medications   metoprolol tartrate (LOPRESSOR) 100 MG tablet    Sig: Take 1 tablet (100 mg total) by mouth once for 1 dose. TAKE TWO HOURS PRIOR TO CARDIAC CT    Dispense:  1 tablet    Refill:  0   ivabradine (CORLANOR) 5 MG TABS tablet    Sig: Take 3 tablets (15 mg total) by mouth once for 1 dose. TAKE TWO HOURS PRIOR TO CARDIAC CT    Dispense:  3 tablet    Refill:  0   ALPRAZolam (XANAX) 0.5 MG tablet    Sig: Take 1 tablet (0.5 mg total) by mouth once for 1 dose. TAKE 30 MINS PRIOR TO CARDIAC CT    Dispense:  1 tablet    Refill:  0    Patient Instructions  Medication Instructions:   Your physician recommends that you continue on your current medications as directed. Please refer to the Current Medication list given to you today.  *If you need a refill on your cardiac medications before your next appointment, please call your  pharmacy*   Lab Work:  Your physician recommends you have labs - BMP   If you have labs (blood work) drawn today and your tests are completely normal, you will receive your results only by: MyChart Message (if you have MyChart) OR A paper copy in the mail If you have any lab test that is abnormal or we need to change your treatment, we will call you to review the results.   Testing/Procedures:  Your physician has requested that you have an echocardiogram. Echocardiography is a painless test that uses sound waves to create images of your heart. It provides your doctor with information about the size and shape of your heart and how well your heart's chambers and valves are working. This procedure takes approximately one hour. There are no restrictions for this procedure. Please do NOT wear cologne, perfume, aftershave, or lotions (deodorant is allowed). Please arrive 15 minutes prior to your appointment time.  Please note: We ask at that you not bring children with you during ultrasound (echo/ vascular) testing. Due to room size and safety concerns, children are not allowed in the ultrasound rooms during exams. Our front office staff cannot provide observation of children in our lobby area while testing is being conducted. An adult accompanying a patient to their appointment will only be allowed in the ultrasound room at the discretion of the ultrasound technician under special circumstances. We apologize for any inconvenience.    Your cardiac CT will be scheduled at one of the below locations:   [facility] [address] [place], [place] [address] [phone]  If scheduled at [facility], please arrive 15 mins early for check-in and test prep.  There is spacious parking and easy access to the radiology department from the [facility] Heart and Vascular entrance. Please  enter here and check-in with the desk attendant.   Please follow these  instructions carefully (unless otherwise directed):  An IV will be required for this test and Nitroglycerin will be given.  Hold all erectile dysfunction medications at least 3 days (72 hrs) prior to test. (Ie viagra, cialis, sildenafil, tadalafil, etc)   On the Night Before the Test: Be sure to Drink plenty of water. Do not consume any caffeinated/decaffeinated beverages or chocolate 12 hours prior to your test. Do not take any antihistamines 12 hours prior to your test.  On the Day of the Test: Drink plenty of water until 1 hour prior to the test. Do not eat any food 1 hour prior to test. You may take your regular medications prior to the test.  Take metoprolol (Lopressor) two hours prior to test. Take Corlanor 2 hours prior to cardiac ct Take Xanax 0.5 mg 30 mins prior to Cardiac CT Patients who wear a continuous glucose monitor MUST remove the device prior to scanning. FEMALES- please wear underwire-free bra if available, avoid dresses & tight clothing      After the Test: Drink plenty of water. After receiving IV contrast, you may experience a mild flushed feeling. This is normal. On occasion, you may experience a mild rash up to 24 hours after the test. This is not dangerous. If this occurs, you can take Benadryl 25 mg and increase your fluid intake. If you experience trouble breathing, this can be serious. If it is severe call 911 IMMEDIATELY. If it is mild, please call our office.  We will call to schedule your test 2-4 weeks out understanding that some insurance companies will need an authorization prior to the service being performed.   For more information and frequently asked questions, please visit our website : [email or web address]  For non-scheduling related questions, please contact the cardiac imaging nurse navigator should you have any questions/concerns: Cardiac Imaging Nurse Navigators Direct Office Dial: [phone]   For scheduling needs,  including cancellations and rescheduling, please call [place], [phone].   Follow-Up: At [facility], you and your health needs are our priority.  As part of our continuing mission to provide you with exceptional heart care, we have created designated Provider Care Teams.  These Care Teams include your primary Cardiologist (physician) and Advanced Practice Providers (APPs -  Physician Assistants and Nurse Practitioners) who all work together to provide you with the care you need, when you need it.  We recommend signing up for the patient portal called "MyChart".  Sign up information is provided on this After Visit Summary.  MyChart is used to connect with patients for Virtual Visits (Telemedicine).  Patients are able to view lab/test results, encounter notes, upcoming appointments, etc.  Non-urgent messages can be sent to your provider as well.   To learn more about what you can do with MyChart, go to [email or web address].    Your next appointment:   2 month(s)  Provider:   You may see Debbe Odea, MD or one of the following Advanced Practice Providers on your designated Care Team:   Nicolasa Ducking, NP Eula Listen, PA-C Cadence Fransico Michael, PA-C Charlsie Quest, NP Carlos Levering, NP      Signed, Debbe Odea, MD  06/12/2023 3:42 PM    [facility]

## 2023-06-13 LAB — BASIC METABOLIC PANEL
BUN/Creatinine Ratio: 15 (ref 9–23)
BUN: 17 mg/dL (ref 6–24)
CO2: 21 mmol/L (ref 20–29)
Calcium: 9.6 mg/dL (ref 8.7–10.2)
Chloride: 103 mmol/L (ref 96–106)
Creatinine, Ser: 1.16 mg/dL — ABNORMAL HIGH (ref 0.57–1.00)
Glucose: 91 mg/dL (ref 70–99)
Potassium: 4.7 mmol/L (ref 3.5–5.2)
Sodium: 140 mmol/L (ref 134–144)
eGFR: 55 mL/min/{1.73_m2} — ABNORMAL LOW (ref >59)

## 2023-06-20 ENCOUNTER — Telehealth (HOSPITAL_COMMUNITY): Payer: Self-pay | Admitting: *Deleted

## 2023-06-20 NOTE — Telephone Encounter (Signed)
Reaching out to patient to offer assistance regarding upcoming cardiac imaging study; pt verbalizes understanding of appt date/time, parking situation and where to check in, pre-test NPO status and medications ordered, and verified current allergies; name and call back number provided for further questions should they arise Hayley Sharpe RN Navigator Cardiac Imaging [facility] [phone] office [phone] cell  

## 2023-06-23 ENCOUNTER — Ambulatory Visit
Admission: RE | Admit: 2023-06-23 | Discharge: 2023-06-23 | Disposition: A | Discharge status: Home / Self Care | Payer: 59 | Source: Ambulatory Visit | Attending: Cardiology | Admitting: Cardiology

## 2023-06-23 ENCOUNTER — Telehealth (HOSPITAL_COMMUNITY): Payer: Self-pay | Admitting: Emergency Medicine

## 2023-06-23 DIAGNOSIS — R0609 Other forms of dyspnea: Secondary | ICD-10-CM | POA: Diagnosis not present

## 2023-06-23 MED ORDER — IOHEXOL 350 MG/ML SOLN
80.0000 mL | Freq: Once | INTRAVENOUS | Status: AC | PRN
  Administered 2023-06-23: 80 mL via INTRAVENOUS

## 2023-06-23 MED ORDER — METOPROLOL TARTRATE 5 MG/5ML IV SOLN
INTRAVENOUS | Status: AC
Start: 2023-06-23 — End: ?
  Filled 2023-06-23: qty 10

## 2023-06-23 MED ORDER — METOPROLOL TARTRATE 5 MG/5ML IV SOLN
10.0000 mg | INTRAVENOUS | Status: DC | PRN
  Administered 2023-06-23: 5 mg via INTRAVENOUS
  Filled 2023-06-23 (×2): qty 10

## 2023-06-23 MED ORDER — DILTIAZEM HCL 25 MG/5ML IV SOLN
10.0000 mg | INTRAVENOUS | Status: DC | PRN
  Filled 2023-06-23: qty 5

## 2023-06-23 MED ORDER — NITROGLYCERIN 0.4 MG SL SUBL
0.8000 mg | SUBLINGUAL_TABLET | Freq: Once | SUBLINGUAL | Status: AC
Start: 2023-06-23 — End: 2023-06-23
  Administered 2023-06-23: 0.8 mg via SUBLINGUAL
  Filled 2023-06-23: qty 25

## 2023-06-23 NOTE — Progress Notes (Signed)

## 2023-06-23 NOTE — Telephone Encounter (Signed)
Pt calling to report HR over weekend (61-79bpm). This AM her HR was 61 and 63 before getting out of bed.   She said she's very sensitive to medications and claustrophobic. I advised her to take her xanax for claustro and we could medicate for HR on site when she arrives.   She's very nervous about "dropping too low and passing out".   I will be sure to relay this to the scan team at [facility].  Rockwell Alexandria RN Navigator Cardiac Imaging [facility] Heart and Vascular Services [phone] Office  [phone] Cell

## 2023-07-09 ENCOUNTER — Ambulatory Visit: Payer: 59 | Attending: Cardiology

## 2023-07-09 DIAGNOSIS — R0609 Other forms of dyspnea: Secondary | ICD-10-CM | POA: Diagnosis not present

## 2023-07-09 LAB — ECHOCARDIOGRAM COMPLETE
AV Mean grad: 4 mm[Hg]
AV Peak grad: 6.8 mm[Hg]
Ao pk vel: 1.3 m/s
Area-P 1/2: 3.85 cm2
S' Lateral: 2.7 cm

## 2023-07-10 DIAGNOSIS — I788 Other diseases of capillaries: Secondary | ICD-10-CM | POA: Diagnosis not present

## 2023-07-10 DIAGNOSIS — L578 Other skin changes due to chronic exposure to nonionizing radiation: Secondary | ICD-10-CM | POA: Diagnosis not present

## 2023-07-10 DIAGNOSIS — L570 Actinic keratosis: Secondary | ICD-10-CM | POA: Diagnosis not present

## 2023-07-14 ENCOUNTER — Telehealth: Payer: Self-pay | Admitting: Cardiology

## 2023-07-14 NOTE — Telephone Encounter (Signed)
Patient calling in regards to recent CTA results. Patient asking if she needs an MRI based on the following results from the CTA.  1. 4.1 x 3.1 cm heterogeneous, partially fat containing right adrenal mass. 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 MRI without and with contrast is recommended to confirm a benign process.  Will forward to provider for recommendations.

## 2023-07-14 NOTE — Telephone Encounter (Signed)
  Pt would like to speak with a nurse to discuss CT and echo result. She said, she doesn't want to wait until 08/13/23 appointment to get the result. She have a dentist appointment and she will be available to receive a call around 4:30 to 4:45 pm

## 2023-07-15 NOTE — Telephone Encounter (Signed)
Called patient and notified her of the following from Dr. Azucena Cecil.   Coronary CT showed no CAD.  Incidental right adrenal mass noted.  Recommend follow-up with primary care provider for any workup and additional testing such as an MRI as suggested by radiology.   Patient verbalizes understanding.

## 2023-07-16 ENCOUNTER — Telehealth: Payer: Self-pay

## 2023-07-16 DIAGNOSIS — E278 Other specified disorders of adrenal gland: Secondary | ICD-10-CM

## 2023-07-16 NOTE — Telephone Encounter (Unsigned)
Copied from CRM [id number]. Topic: Clinical - Medication Question >> Jul 15, 2023  8:55 AM Shon Hale wrote: Reason for CRM: Pt states she had coronary CT scan done with cardiologist. Pt was told to follow up with PCP to schedule an MRI with and without contrast as a mass was detected. Results are available in epic. Pt requesting for MRI to be scheduled.  Please assist pt further, pt requesting call with update.

## 2023-07-17 NOTE — Telephone Encounter (Signed)
Patient made aware.

## 2023-07-17 NOTE — Telephone Encounter (Signed)
CT shows - 4.1 x 3.1 cm heterogeneous, partially fat containing right adrenal mass. 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 MRI without and with contrast is recommended to confirm a benign process.  MRI ordered.  Will follow up with results.

## 2023-07-18 ENCOUNTER — Other Ambulatory Visit: Payer: Self-pay | Admitting: Family Medicine

## 2023-07-18 DIAGNOSIS — E278 Other specified disorders of adrenal gland: Secondary | ICD-10-CM

## 2023-07-18 NOTE — Progress Notes (Signed)
Changing imaging location to [facility]. [facility]

## 2023-07-22 DIAGNOSIS — R109 Unspecified abdominal pain: Secondary | ICD-10-CM | POA: Diagnosis not present

## 2023-07-22 DIAGNOSIS — R319 Hematuria, unspecified: Secondary | ICD-10-CM | POA: Diagnosis not present

## 2023-07-22 DIAGNOSIS — R944 Abnormal results of kidney function studies: Secondary | ICD-10-CM | POA: Diagnosis not present

## 2023-07-22 DIAGNOSIS — N390 Urinary tract infection, site not specified: Secondary | ICD-10-CM | POA: Diagnosis not present

## 2023-07-22 DIAGNOSIS — R1031 Right lower quadrant pain: Secondary | ICD-10-CM | POA: Diagnosis not present

## 2023-07-24 ENCOUNTER — Ambulatory Visit: Payer: Self-pay | Admitting: *Deleted

## 2023-07-24 ENCOUNTER — Telehealth: Payer: Self-pay | Admitting: Family Medicine

## 2023-07-24 ENCOUNTER — Telehealth: Payer: Self-pay

## 2023-07-24 DIAGNOSIS — F40240 Claustrophobia: Secondary | ICD-10-CM

## 2023-07-24 NOTE — Telephone Encounter (Signed)
Copied from CRM [id number]. Topic: Medical Record Request - Other >> Jul 24, 2023  3:51 PM [place] B wrote: Reason for CRM: pt called in about her med records being sent to   [facility], says request has to be on letterhead saying its ok for her records to be sent to them. Fx is [phone]

## 2023-07-24 NOTE — Telephone Encounter (Signed)
  Chief Complaint: medication request: Xanax Symptoms: anxiety, claustrophobic   Disposition: [] ED /[] Urgent Care (no appt availability in office) / [] Appointment(In office/virtual)/ []  [facility]/ [] Home Care/ [] Refused Recommended Disposition /[] [facility]/ [x]  Follow-up with PCP Additional Notes: Patient has MRI scheduled 08/05/23- requesting per medication    Additional note: Patient is in [facility]- she was seen at ED Tuesday 1/28 - hematuria, urinary symptoms Patient states she had labs, CT scan and was treated for UTI- although the provider did not think that was cause of symptoms Patient states provider advised - urology referral due to kidney labs- function was 59 and patient was advised stage 3 kidney failure. Patient is getting the visit faxed to office- she is going to medical records office today. She is requesting a referral to urology if PCP will do that before she is seen.

## 2023-07-24 NOTE — Telephone Encounter (Signed)
Referral Request - Did the patient discuss referral with their provider in the last year? No Pt holding off on scheduling appointment until after MRI 08/05/2023, needs appointment to discuss MRI results and willing to discuss referral request then. Pt requesting referral to be started in the meantime.   Appointment offered? Yes  Type of order/referral and detailed reason for visit: Urologist , pt had visit to hospital in [facility] & informed to reach out to PCP for urologist referral. Pt being treated for UTI, pt had blood in urine.   Preference of office, provider, location: N/A, wants a well recommended urologist  If referral order, have you been seen by this specialty before? Yes, pt unsure of the name.  Can we respond through MyChart? No, patient is requesting a call. Willing to schedule virtual visit.

## 2023-07-24 NOTE — Telephone Encounter (Signed)
Summary: Rx requested - mri, claustrophobic   Pt is claustrophobic and requesting rx for the MRI that is scheduled 08/05/23.         Reason for Disposition . Prescription request for new medicine (not a refill)  Answer Assessment - Initial Assessment Questions 1. NAME of MEDICINE: "What medicine(s) are you calling about?"     Xanax Rx 2. QUESTION: "What is your question?" (e.g., double dose of medicine, side effect)     Patient has anxiety/claustrophobic- is request pre procedure medication 3. PRESCRIBER: "Who prescribed the medicine?" Reason: if prescribed by specialist, call should be referred to that group.     PCP 4. SYMPTOMS: "Do you have any symptoms?" If Yes, ask: "What symptoms are you having?"  "How bad are the symptoms (e.g., mild, moderate, severe)     Patient is very claustrophobic- she has had to have pre medication is past.  Protocols used: Medication Question Call-A-AH

## 2023-07-25 MED ORDER — ALPRAZOLAM 0.5 MG PO TABS
0.5000 mg | ORAL_TABLET | Freq: Once | ORAL | 0 refills | Status: AC
Start: 2023-07-25 — End: 2023-07-25

## 2023-07-25 NOTE — Telephone Encounter (Signed)
Typically change in kidney function sees nephrology, not urology.  Hematuria typically needs urology.  Want to review records before sending referral as it may determine who to send her to.

## 2023-07-25 NOTE — Telephone Encounter (Signed)
 Pt advised. Verbalized understanding.

## 2023-07-25 NOTE — Telephone Encounter (Signed)
Letter sent and patient advised records would be requested She also reported they provided her a CD with her imaging that she can bring by the office once she has returned if we are unable to get the imaging results FYI

## 2023-07-25 NOTE — Telephone Encounter (Signed)
Ok to send the medical record release letter (like we send for eye exams I guess if she doesn't need formal ROI). Want notes, labs and imaging.

## 2023-07-25 NOTE — Addendum Note (Signed)
Addended by: Erasmo Downer on: 07/25/2023 11:49 AM   Modules accepted: Orders

## 2023-07-28 ENCOUNTER — Ambulatory Visit: Payer: 59

## 2023-07-30 ENCOUNTER — Encounter: Payer: Self-pay | Admitting: Family Medicine

## 2023-07-31 NOTE — Telephone Encounter (Signed)
 I don't have anything and didn't see it in media. Have any of you seen it come through?  She likely needs to sign official ROI form and have us  fax it (see last phone note where she just wanted letter sent to them)

## 2023-08-01 ENCOUNTER — Other Ambulatory Visit: Payer: Self-pay | Admitting: Family Medicine

## 2023-08-01 DIAGNOSIS — E278 Other specified disorders of adrenal gland: Secondary | ICD-10-CM

## 2023-08-01 DIAGNOSIS — R319 Hematuria, unspecified: Secondary | ICD-10-CM

## 2023-08-01 NOTE — Progress Notes (Signed)
 ED visit reviewed - had hematuria and wants urology follow-up - referral placed. 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 Has future MRI scheduled to further eval.

## 2023-08-05 ENCOUNTER — Ambulatory Visit
Admission: RE | Admit: 2023-08-05 | Discharge: 2023-08-05 | Disposition: A | Discharge status: Home / Self Care | Payer: 59 | Source: Ambulatory Visit | Attending: Family Medicine | Admitting: Family Medicine

## 2023-08-05 DIAGNOSIS — E278 Other specified disorders of adrenal gland: Secondary | ICD-10-CM | POA: Diagnosis not present

## 2023-08-05 MED ORDER — GADOPICLENOL 0.5 MMOL/ML IV SOLN
7.0000 mL | Freq: Once | INTRAVENOUS | Status: AC | PRN
  Administered 2023-08-05: 7 mL via INTRAVENOUS

## 2023-08-07 ENCOUNTER — Encounter: Payer: Self-pay | Admitting: Family Medicine

## 2023-08-08 ENCOUNTER — Other Ambulatory Visit: Payer: Self-pay

## 2023-08-08 DIAGNOSIS — D1779 Benign lipomatous neoplasm of other sites: Secondary | ICD-10-CM

## 2023-08-11 ENCOUNTER — Other Ambulatory Visit: Payer: Self-pay | Admitting: Family Medicine

## 2023-08-11 DIAGNOSIS — Z1231 Encounter for screening mammogram for malignant neoplasm of breast: Secondary | ICD-10-CM

## 2023-08-13 ENCOUNTER — Ambulatory Visit: Payer: 59 | Attending: Cardiology | Admitting: Cardiology

## 2023-08-13 ENCOUNTER — Encounter: Payer: Self-pay | Admitting: Cardiology

## 2023-08-13 VITALS — BP 124/80 | HR 77 | Ht 66.0 in | Wt 159.2 lb

## 2023-08-13 DIAGNOSIS — R0609 Other forms of dyspnea: Secondary | ICD-10-CM | POA: Diagnosis not present

## 2023-08-13 DIAGNOSIS — E7800 Pure hypercholesterolemia, unspecified: Secondary | ICD-10-CM

## 2023-08-13 NOTE — Patient Instructions (Signed)
 Medication Instructions:   Your physician recommends that you continue on your current medications as directed. Please refer to the Current Medication list given to you today.  *If you need a refill on your cardiac medications before your next appointment, please call your pharmacy*   Lab Work:  None Ordered  If you have labs (blood work) drawn today and your tests are completely normal, you will receive your results only by: MyChart Message (if you have MyChart) OR A paper copy in the mail If you have any lab test that is abnormal or we need to change your treatment, we will call you to review the results.   Testing/Procedures:  None Ordered   Follow-Up: At [facility], you and your health needs are our priority.  As part of our continuing mission to provide you with exceptional heart care, we have created designated Provider Care Teams.  These Care Teams include your primary Cardiologist (physician) and Advanced Practice Providers (APPs -  Physician Assistants and Nurse Practitioners) who all work together to provide you with the care you need, when you need it.  We recommend signing up for the patient portal called "MyChart".  Sign up information is provided on this After Visit Summary.  MyChart is used to connect with patients for Virtual Visits (Telemedicine).  Patients are able to view lab/test results, encounter notes, upcoming appointments, etc.  Non-urgent messages can be sent to your provider as well.   To learn more about what you can do with MyChart, go to [email or web address].    Your next appointment:  AS NEEDED

## 2023-08-13 NOTE — Progress Notes (Signed)
 Cardiology Office Note:    Date:  08/13/2023   ID:  Joanne Wade, DOB 1965-08-24, MRN [id number]  PCP:  Erasmo Downer, MD   [facility] Providers Cardiologist:  Debbe Odea, MD     Referring MD: Erasmo Downer, MD   Chief Complaint  Patient presents with   Follow-up    Discuss cardiac testing results. Since last visit patient has been seen in ED at [facility] and was told could have kidney failure.  Patient reports new feeling exhausted and "not feeling good".  Denies chest pains.     History of Present Illness:    Joanne Wade is a 58 y.o. female with a hx of hyperlipidemia who presents for follow-up.  Previously seen due to dyspnea on exertion.  Echo and coronary CT was obtained to evaluate cardiac etiology.  States feeling fatigued, not sure if Greggory Keen is contributing to this.  Had some abdominal pain, evaluated in the ED where abdominal CT showed an adrenal gland mass.  Has an appointment with surgery for possible removal.  Also plans to follow-up with urology and nephrology for polyuria and renal dysfunction.   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[facility])     Past Surgical History:  Procedure Laterality Date   ABDOMINAL HYSTERECTOMY     still has cervix   APPENDECTOMY     COLONOSCOPY WITH PROPOFOL N/A 11/25/2017   Procedure: COLONOSCOPY WITH PROPOFOL;  Surgeon: Midge Minium, MD;  Location: [facility] ENDOSCOPY;  Service: Endoscopy;  Laterality: N/A;   EYE SURGERY Bilateral    03/25/2017 and 04/15/2017 "new lenses put in my eyes"   ROTATOR CUFF REPAIR     TONSILLECTOMY AND ADENOIDECTOMY  1972    Current Medications: Current Meds  Medication Sig   butalbital-acetaminophen-caffeine (FIORICET, ESGIC) 50-325-40 MG tablet Take 1 tablet by mouth 2 (two) times daily as needed for  headache (Every 6 hours as needed for headache).   fluticasone (FLONASE) 50 MCG/ACT nasal spray Place 2 sprays into both nostrils daily.   MIEBO 1.338 GM/ML SOLN Apply to eye.   montelukast (SINGULAIR) 10 MG tablet Take 1 tablet (10 mg total) by mouth at bedtime.   OVER THE COUNTER MEDICATION Take 2 tablets by mouth daily. Fresh fruit & vegetable vitamin   tirzepatide (MOUNJARO) 7.5 MG/0.5ML Pen Inject 7.5 mg into the skin once a week.   VEVYE 0.1 % SOLN Apply to eye.     Allergies:   Morphine, Belladonna alkaloids, Lexapro [escitalopram oxalate], Morphine and codeine, and Tramadol   Social History   Socioeconomic History   Marital status: Married    Spouse name: Not on file   Number of children: 2   Years of education: Not on file   Highest education level: Not on file  Occupational History   Not on file  Tobacco Use   Smoking status: Never   Smokeless tobacco: Never  Vaping Use   Vaping status: Never Used  Substance and Sexual Activity   Alcohol use: Yes    Comment: ocassional   Drug use: No   Sexual activity: Yes    Birth control/protection: Surgical  Other Topics Concern   Not on file  Social History Narrative   Not on file   Social Drivers of Health   Financial  Resource Strain: Not on file  Food Insecurity: Not on file  Transportation Needs: Not on file  Physical Activity: Not on file  Stress: Not on file  Social Connections: Unknown (11/05/2021)   Received from [facility]   Social Network    Social Network: Not on file     Family History: The patient's family history includes Alcohol abuse in her mother; Cancer in her father and mother; Colon cancer in her paternal grandfather; Heart disease in her maternal grandmother; Hypertension in her mother and paternal uncle; Melanoma in her mother. There is no history of Breast cancer.  ROS:   Please see the history of present illness.     All other systems reviewed and are  negative.  EKGs/Labs/Other Studies Reviewed:    The following studies were reviewed today:       Recent Labs: 06/12/2023: BUN 17; Creatinine, Ser 1.16; Potassium 4.7; Sodium 140  Recent Lipid Panel    Component Value Date/Time   CHOL 251 (A) 07/30/2022 0000   CHOL 215 (H) 11/14/2021 1005   TRIG 125 07/30/2022 0000   HDL 64 07/30/2022 0000   HDL 50 11/14/2021 1005   CHOLHDL 4.3 11/14/2021 1005   CHOLHDL 4.2 05/20/2017 0915   LDLCALC 162 07/30/2022 0000   LDLCALC 152 (H) 11/14/2021 1005   LDLCALC 146 (H) 05/20/2017 0915     Risk Assessment/Calculations:         Physical Exam:    VS:  BP 124/80 (BP Location: Left Arm, Patient Position: Sitting, Cuff Size: Normal)   Pulse 77   Ht 5\' 6"  (1.676 m)   Wt 159 lb 3.2 oz (72.2 kg)   SpO2 99%   BMI 25.70 kg/m     Wt Readings from Last 3 Encounters:  08/13/23 159 lb 3.2 oz (72.2 kg)  06/12/23 165 lb 3.2 oz (74.9 kg)  09/30/22 169 lb (76.7 kg)     GEN:  Well nourished, well developed in no acute distress HEENT: Normal NECK: No JVD; No carotid bruits CARDIAC: RRR, no murmurs, rubs, gallops RESPIRATORY:  Clear to auscultation without rales, wheezing or rhonchi  ABDOMEN: Soft, non-tender, non-distended MUSCULOSKELETAL:  No edema; No deformity  SKIN: Warm and dry NEUROLOGIC:  Alert and oriented x 3 PSYCHIATRIC:  Normal affect   ASSESSMENT:    1. Dyspnea on exertion   2. Pure hypercholesterolemia    PLAN:    In order of problems listed above:  Dyspnea on exertion, coronary CT 05/2023 showed no CAD, calcium score 0.  Echo 06/2023 EF 60 to 65%.  No findings to suggest etiology of dyspnea. Hyperlipidemia, 10-year ASCVD risk 2.4%.  Low-cholesterol diet advised.   Follow-up as needed.      Medication Adjustments/Labs and Tests Ordered: Current medicines are reviewed at length with the patient today.  Concerns regarding medicines are outlined above.  No orders of the defined types were placed in this encounter.  No  orders of the defined types were placed in this encounter.   Patient Instructions  Medication Instructions:   Your physician recommends that you continue on your current medications as directed. Please refer to the Current Medication list given to you today.  *If you need a refill on your cardiac medications before your next appointment, please call your pharmacy*   Lab Work:  None Ordered  If you have labs (blood work) drawn today and your tests are completely normal, you will receive your results only by: MyChart Message (if you have MyChart) OR  A paper copy in the mail If you have any lab test that is abnormal or we need to change your treatment, we will call you to review the results.   Testing/Procedures:  None Ordered   Follow-Up: At [facility], you and your health needs are our priority.  As part of our continuing mission to provide you with exceptional heart care, we have created designated Provider Care Teams.  These Care Teams include your primary Cardiologist (physician) and Advanced Practice Providers (APPs -  Physician Assistants and Nurse Practitioners) who all work together to provide you with the care you need, when you need it.  We recommend signing up for the patient portal called "MyChart".  Sign up information is provided on this After Visit Summary.  MyChart is used to connect with patients for Virtual Visits (Telemedicine).  Patients are able to view lab/test results, encounter notes, upcoming appointments, etc.  Non-urgent messages can be sent to your provider as well.   To learn more about what you can do with MyChart, go to [email or web address].    Your next appointment:    AS NEEDED   Signed, Debbe Odea, MD  08/13/2023 10:56 AM    [facility]

## 2023-08-20 ENCOUNTER — Ambulatory Visit: Payer: 59 | Admitting: Surgery

## 2023-08-20 ENCOUNTER — Encounter: Payer: Self-pay | Admitting: Surgery

## 2023-08-20 VITALS — BP 131/77 | HR 70 | Temp 98.1°F | Ht 66.0 in | Wt 157.0 lb

## 2023-08-20 DIAGNOSIS — D1779 Benign lipomatous neoplasm of other sites: Secondary | ICD-10-CM | POA: Diagnosis not present

## 2023-08-20 MED ORDER — DEXAMETHASONE 1 MG PO TABS: ORAL_TABLET | ORAL | 0 refills | Status: DC

## 2023-08-20 NOTE — Progress Notes (Unsigned)
 Patient ID: Joanne Wade, female   DOB: 1965-09-18, 58 y.o.   MRN: [id number]  HPI Joanne Wade is a 58 y.o. female seen in consultation at the request of Dr.Bacigalupo for right adrenal mass. This was discovered after CT coronary was done. She went to cardiologist due to palpitation. CT coronary images show evidence of a right adrenal mass.  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  No evidence of complicating features. More recently endorses fatigue an weakness SHe is currently on GLP-1 inhibitor and has lost about 20 pounds or so Of note she did have a CT scan of the abdomen and pelvis back in 2008 at that time there was  no adrenal mass.( Per report) She did have a prior history of abdominal hysterectomy as well as appendectomy Is able to perform more than 4 METS of activity without shortness of breath or chest pain Her creatinine is 1.03 which is mildly elevated CBC is normal She is to take a lot of NSAIDs for musculoskeletal complaints but has stopped  HPI  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[facility])     Past Surgical History:  Procedure Laterality Date   ABDOMINAL HYSTERECTOMY     still has cervix   APPENDECTOMY     COLONOSCOPY WITH PROPOFOL N/A 11/25/2017   Procedure: COLONOSCOPY WITH PROPOFOL;  Surgeon: Midge Minium, MD;  Location: [facility] ENDOSCOPY;  Service: Endoscopy;  Laterality: N/A;   EYE SURGERY Bilateral    03/25/2017 and 04/15/2017 "new lenses put in my eyes"   ROTATOR CUFF REPAIR Right    TONSILLECTOMY AND ADENOIDECTOMY  1972    Family History  Problem Relation Age of Onset   Alcohol abuse Mother    Hypertension Mother    Melanoma Mother    Cancer Mother        brain and melanoma   Cancer Father        lung    Hypertension Paternal Uncle    Heart disease Maternal Grandmother    Colon cancer Paternal Grandfather    Breast cancer Neg Hx     Social History Social History   Tobacco Use   Smoking status: Never    Passive exposure: Never   Smokeless tobacco: Never  Vaping Use   Vaping status: Never Used  Substance Use Topics   Alcohol use: Yes    Comment: ocassional   Drug use: No    Allergies  Allergen Reactions   Morphine Other (See Comments)    Other Reaction: Other reaction   Belladonna Alkaloids    Lexapro [Escitalopram Oxalate]    Morphine And Codeine Itching   Tramadol Itching    Current Outpatient Medications  Medication Sig Dispense Refill   butalbital-acetaminophen-caffeine (FIORICET, ESGIC) 50-325-40 MG tablet Take 1 tablet by mouth 2 (two) times daily as needed for headache (Every 6 hours as needed for headache). 20 tablet 1   dexamethasone (DECADRON) 1 MG tablet Take full dose the night before you are going to have your lab work done. You need to have your labs drawn between 8am and 9am the next day. 1 tablet 0   fluticasone (FLONASE) 50 MCG/ACT nasal spray Place 2 sprays into both nostrils daily. 16  g 3   MIEBO 1.338 GM/ML SOLN Apply to eye.     montelukast (SINGULAIR) 10 MG tablet Take 1 tablet (10 mg total) by mouth at bedtime. 90 tablet 3   OVER THE COUNTER MEDICATION Take 2 tablets by mouth daily. Fresh fruit & vegetable vitamin     tirzepatide (MOUNJARO) 7.5 MG/0.5ML Pen Inject 7.5 mg into the skin once a week.     VEVYE 0.1 % SOLN Apply to eye.     No current facility-administered medications for this visit.     Review of Systems Full ROS  was asked and was negative except for the information on the HPI  Physical Exam Blood pressure 131/77, pulse 70, temperature 98.1 F (36.7 C), height 5\' 6"  (1.676 m), weight 157 lb (71.2 kg), SpO2 99%. CONSTITUTIONAL: NAD. EYES: Pupils are equal, round, Sclera are non-icteric. EARS, NOSE, MOUTH AND THROAT: The  oropharynx is clear. The oral mucosa is pink and moist. Hearing is intact to voice. LYMPH NODES:  Lymph nodes in the neck are normal. RESPIRATORY:  Lungs are clear. There is normal respiratory effort, with equal breath sounds bilaterally, and without pathologic use of accessory muscles. CARDIOVASCULAR: Heart is regular without murmurs, gallops, or rubs. GI: The abdomen is  soft, nontender, and nondistended. There are no palpable masses. There is no hepatosplenomegaly. There are normal bowel sounds GU: Rectal deferred.   MUSCULOSKELETAL: Normal muscle strength and tone. No cyanosis or edema.   SKIN: Turgor is good and there are no pathologic skin lesions or ulcers. NEUROLOGIC: Motor and sensation is grossly normal. Cranial nerves are grossly intact. PSYCH:  Oriented to person, place and time. Affect is normal.  Data Reviewed  I have personally reviewed the patient's imaging, laboratory findings and medical records.    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  I do think that given the size it will be prudent to perform a functional assessment.  I have also discussed with her in detail about adrenal masses.  Specifically discussed the option of follow-up w future images versus potential surgical intervention.  The patient does have significant concerns that the mass may continue to enlarge.  She is also concerned that in the future she will not be as healthy as she is right now.  Is also concerned about a potential complication of bleeding. I do think that all those are reasonable concerns and she probably is going to be a good candidate for robotic right adrenalectomy. She Did have significant question about her disease process about interventions.  I was able to answer all the questions to my best of my abilities.  Risk of bleeding infection intra-abdominal injuries were described to the patient in detail. He does have an upcoming trip to  [place].  Even that she is very active might be wiser to wait until she comes back from the trip .  I will see him again after she completes her biochemical workup Please note that I spent greater than 60 minutes's encounter including extensive review of medical records, personally reviewing imaging studies, coordinating her care, placing orders and performing documentation A copy of this report was sent to the referring provider  Sterling Big, MD FACS General Surgeon 08/20/2023, 3:23 PM

## 2023-08-20 NOTE — Patient Instructions (Addendum)
 We have order some lab work for you to have done. You will do this at [facility], enter in through the [facility] entrance. You will need to have nothing to eat after midnight the night prior to your blood test and nothing but water in the morning.  We will prescribe Dexamethasone 1mg  to be taken the night prior to your blood test, take this right before going to bed.  You will need to have your blood work drawn between 8am and 9 am. So do arrive at the hospital no later than 8 am.  We will have you follow up here after we get your results to discuss the next steps.

## 2023-08-21 ENCOUNTER — Encounter: Payer: Self-pay | Admitting: Surgery

## 2023-08-26 ENCOUNTER — Other Ambulatory Visit
Admission: RE | Admit: 2023-08-26 | Discharge: 2023-08-26 | Disposition: A | Discharge status: Home / Self Care | Attending: Surgery | Admitting: Surgery

## 2023-08-26 ENCOUNTER — Telehealth: Payer: Self-pay

## 2023-08-26 DIAGNOSIS — D1779 Benign lipomatous neoplasm of other sites: Secondary | ICD-10-CM | POA: Insufficient documentation

## 2023-08-26 LAB — COMPREHENSIVE METABOLIC PANEL
ALT: 23 U/L (ref 0–44)
AST: 23 U/L (ref 15–41)
Albumin: 4.2 g/dL (ref 3.5–5.0)
Alkaline Phosphatase: 65 U/L (ref 38–126)
Anion gap: 7 (ref 5–15)
BUN: 13 mg/dL (ref 6–20)
CO2: 24 mmol/L (ref 22–32)
Calcium: 9 mg/dL (ref 8.9–10.3)
Chloride: 109 mmol/L (ref 98–111)
Creatinine, Ser: 0.95 mg/dL (ref 0.44–1.00)
GFR, Estimated: >60 mL/min (ref >60)
Glucose, Bld: 113 mg/dL — ABNORMAL HIGH (ref 70–99)
Potassium: 4.1 mmol/L (ref 3.5–5.1)
Sodium: 140 mmol/L (ref 135–145)
Total Bilirubin: 0.6 mg/dL (ref 0.0–1.2)
Total Protein: 7.1 g/dL (ref 6.5–8.1)

## 2023-08-26 LAB — CORTISOL-AM, BLOOD: Cortisol - AM: 0.9 ug/dL — ABNORMAL LOW (ref 6.7–22.6)

## 2023-08-26 NOTE — Telephone Encounter (Signed)
 We cannot call providers directly and find out about how many they have done. She is welcome to reach out to the office and find out if he has done multiples of these from the staff. We can place a referral if this is where she wants to go

## 2023-08-26 NOTE — Telephone Encounter (Signed)
 Copied from CRM [id number]. Topic: Referral - Status >> Aug 26, 2023 11:54 AM Macon Large wrote: Reason for CRM: Patient stated that she has a rare condition (Myelolipoma) and she would like to request a referral for a second opinion. Patient stated that she would like to see a doctor that has done more than 1 procedure regarding this rare condition. Patient requests that Dr. B call Dr. Esmeralda Links with [facility] phone# [phone] option# 6 to see if he has done more than one procedure. Patient request that a referral be sent to that office if Dr. Othella Boyer has done more than one procedure. Call back# [phone]

## 2023-08-28 LAB — METANEPHRINES, PLASMA
Metanephrine, Free: 30.9 pg/mL (ref 0.0–88.0)
Normetanephrine, Free: 71.1 pg/mL (ref 0.0–244.0)

## 2023-08-28 NOTE — Telephone Encounter (Signed)
 Ok to place referral. She will have to discuss with them. It is not a HIPAA violation to discuss if you are experienced with a certain procedure.

## 2023-08-29 ENCOUNTER — Other Ambulatory Visit: Payer: Self-pay

## 2023-08-29 DIAGNOSIS — R319 Hematuria, unspecified: Secondary | ICD-10-CM

## 2023-09-01 LAB — ALDOSTERONE + RENIN ACTIVITY W/ RATIO
ALDO / PRA Ratio: 16.5 (ref 0.0–30.0)
Aldosterone: 9.2 ng/dL (ref 0.0–30.0)
PRA LC/MS/MS: 0.557 ng/mL/h (ref 0.167–5.380)

## 2023-09-03 ENCOUNTER — Telehealth: Payer: Self-pay

## 2023-09-03 ENCOUNTER — Ambulatory Visit: Payer: 59 | Admitting: Urology

## 2023-09-03 ENCOUNTER — Other Ambulatory Visit
Admission: RE | Admit: 2023-09-03 | Discharge: 2023-09-03 | Disposition: A | Discharge status: Home / Self Care | Attending: Urology | Admitting: Urology

## 2023-09-03 VITALS — BP 129/83 | HR 61 | Ht 66.0 in | Wt 160.0 lb

## 2023-09-03 DIAGNOSIS — N958 Other specified menopausal and perimenopausal disorders: Secondary | ICD-10-CM | POA: Diagnosis not present

## 2023-09-03 DIAGNOSIS — R319 Hematuria, unspecified: Secondary | ICD-10-CM | POA: Diagnosis not present

## 2023-09-03 DIAGNOSIS — E278 Other specified disorders of adrenal gland: Secondary | ICD-10-CM

## 2023-09-03 LAB — URINALYSIS, COMPLETE (UACMP) WITH MICROSCOPIC
Bilirubin Urine: NEGATIVE
Glucose, UA: NEGATIVE mg/dL
Ketones, ur: NEGATIVE mg/dL
Nitrite: NEGATIVE
Protein, ur: NEGATIVE mg/dL
Specific Gravity, Urine: 1.01 (ref 1.005–1.030)
pH: 6 (ref 5.0–8.0)

## 2023-09-03 MED ORDER — ESTRADIOL 0.1 MG/GM VA CREA: TOPICAL_CREAM | VAGINAL | 12 refills | Status: DC

## 2023-09-03 NOTE — Telephone Encounter (Signed)
 Called pt and left vm advising pt that we need a fax number to send this information.  When looking it up it shows 4 different locations for this provider.

## 2023-09-03 NOTE — Telephone Encounter (Signed)
 Copied from CRM [id number]. Topic: Clinical - Lab/Test Results >> Sep 03, 2023  2:43 PM Carlatta H wrote: Reason for CRM: Patient needs to make sure that all imaging will be sent to Dr Brynda Greathouse Scheri//

## 2023-09-03 NOTE — Progress Notes (Signed)
 09/03/23 1:07 PM   Joanne Wade August 30, 1965 [id number]  CC: Right adrenal mass, urinary symptoms, recurrent UTI  HPI: Healthy 58 year old female referred for the above issues.  Recently found to have a 4 cm right-sided adrenal mass on MRI that has increased in size from 2 cm on prior CT from 2000 and, felt to be benign based on MRI, currently undergoing a functional workup, as well as evaluation with Dr. Everlene Farrier with general surgery to consider resection based on size.  She also has an appointment with a surgical oncologist at [facility] to consider removal.  Her primary complaint today is recurrent urinary tract infections as well as pain with sexual activity and urgency and frequency.  No prior culture data is available to me within the last year.  She consumes a fair amount of fluids during the day.  She had a hysterectomy about 20 years ago.  Renal function is normal with recent creatinine 0.95, EGFR greater than 60.  She thinks many years ago she had a urethral dilation procedure and a urologist told her that her bladder was trabeculated.  Those records are not available to me.  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[facility])     Surgical History: Past Surgical History:  Procedure Laterality Date   ABDOMINAL HYSTERECTOMY     still has cervix   APPENDECTOMY     COLONOSCOPY WITH PROPOFOL N/A 11/25/2017   Procedure: COLONOSCOPY WITH PROPOFOL;  Surgeon: Midge Minium, MD;  Location: [facility] ENDOSCOPY;  Service: Endoscopy;  Laterality: N/A;   EYE SURGERY Bilateral    03/25/2017 and 04/15/2017 "new lenses put in my eyes"   ROTATOR CUFF REPAIR Right    TONSILLECTOMY AND ADENOIDECTOMY  1972     Family History: Family History  Problem Relation Age of Onset   Alcohol abuse Mother    Hypertension Mother    Melanoma Mother    Cancer  Mother        brain and melanoma   Cancer Father        lung   Hypertension Paternal Uncle    Heart disease Maternal Grandmother    Colon cancer Paternal Grandfather    Breast cancer Neg Hx     Social History:  reports that she has never smoked. She has never been exposed to tobacco smoke. She has never used smokeless tobacco. She reports current alcohol use. She reports that she does not use drugs.  Physical Exam: BP 129/83 (BP Location: Left Arm, Patient Position: Sitting, Cuff Size: Normal)   Pulse 61   Ht 5\' 6"  (1.676 m)   Wt 160 lb (72.6 kg)   SpO2 97%   BMI 25.82 kg/m    Constitutional:  Alert and oriented, No acute distress. Cardiovascular: No clubbing, cyanosis, or edema. Respiratory: Normal respiratory effort, no increased work of breathing. GI: Abdomen is soft, nontender, nondistended, no abdominal masses   Laboratory Data: Reviewed, see HPI  Pertinent Imaging: I have personally viewed and interpreted the MRI abdomen showing a 4 cm right adrenal mass, increased in size from 2 cm on CT from 2008.  Assessment & Plan:   58 year old female with a 4 cm right adrenal mass currently undergoing metabolic evaluation as well as evaluation with general surgery as well as surgical oncology at [facility] to  consider excision.  In terms of her urinary symptoms she reports recurrent UTIs and pain with sexual activity, as well as urgency and frequency.  These may be related to genitourinary syndrome of menopause and I recommended a trial of topical estrogen cream first.  Could consider an OAB medication in the future  Trial of topical estrogen cream, RTC 3 months Consider cystoscopy in the future if worsening symptoms  Legrand Rams, MD 09/03/2023  [facility] [address] [place], [place] [address] [phone]

## 2023-09-03 NOTE — Telephone Encounter (Signed)
 Copied from CRM [id number]. Topic: General - Other >> Sep 03, 2023  2:44 PM Carlatta H wrote: Reason for CRM: Please let patient know via mychart that imaging has been sent to Dr Esmeralda Links

## 2023-09-04 LAB — URINE CULTURE: Culture: <10000 — AB

## 2023-09-08 ENCOUNTER — Telehealth: Payer: Self-pay | Admitting: *Deleted

## 2023-09-08 NOTE — Telephone Encounter (Signed)
 Patient called and wants to know her lab results. Dr Everlene Farrier order them, she is aware that he is out of the office this week but would like to know her results before he gets back.

## 2023-09-17 ENCOUNTER — Ambulatory Visit
Admission: RE | Admit: 2023-09-17 | Discharge: 2023-09-17 | Disposition: A | Discharge status: Home / Self Care | Source: Ambulatory Visit | Attending: Family Medicine | Admitting: Family Medicine

## 2023-09-17 DIAGNOSIS — Z1231 Encounter for screening mammogram for malignant neoplasm of breast: Secondary | ICD-10-CM | POA: Diagnosis not present

## 2023-09-22 ENCOUNTER — Encounter: Payer: Self-pay | Admitting: Family Medicine

## 2023-09-22 DIAGNOSIS — D3501 Benign neoplasm of right adrenal gland: Secondary | ICD-10-CM | POA: Diagnosis not present

## 2023-10-18 ENCOUNTER — Other Ambulatory Visit: Payer: Self-pay | Admitting: Family Medicine

## 2023-10-20 NOTE — Telephone Encounter (Signed)
 OFFICE VISIT NEEDED FOR ADDITIONAL REFILLS.  Requested Prescriptions  Pending Prescriptions Disp Refills   montelukast  (SINGULAIR ) 10 MG tablet [Pharmacy Med Name: MONTELUKAST  SODIUM 10 MG TAB] 30 tablet 0    Sig: TAKE 1 TABLET BY MOUTH AT BEDTIME.     Pulmonology:  Leukotriene Inhibitors Failed - 10/20/2023 12:40 PM      Failed - Valid encounter within last 12 months    Recent Outpatient Visits   None     Future Appointments             In 1 month Sninsky, Dennard Fisher, MD [facility] [place]

## 2023-11-19 ENCOUNTER — Ambulatory Visit: Admitting: Surgery

## 2023-11-21 ENCOUNTER — Other Ambulatory Visit: Payer: Self-pay | Admitting: Family Medicine

## 2023-12-09 ENCOUNTER — Ambulatory Visit: Admitting: Urology

## 2023-12-10 DIAGNOSIS — L565 Disseminated superficial actinic porokeratosis (DSAP): Secondary | ICD-10-CM | POA: Diagnosis not present

## 2023-12-10 DIAGNOSIS — Z85828 Personal history of other malignant neoplasm of skin: Secondary | ICD-10-CM | POA: Diagnosis not present

## 2023-12-10 DIAGNOSIS — D2271 Melanocytic nevi of right lower limb, including hip: Secondary | ICD-10-CM | POA: Diagnosis not present

## 2023-12-10 DIAGNOSIS — D2272 Melanocytic nevi of left lower limb, including hip: Secondary | ICD-10-CM | POA: Diagnosis not present

## 2023-12-10 DIAGNOSIS — L570 Actinic keratosis: Secondary | ICD-10-CM | POA: Diagnosis not present

## 2023-12-10 DIAGNOSIS — D2261 Melanocytic nevi of right upper limb, including shoulder: Secondary | ICD-10-CM | POA: Diagnosis not present

## 2023-12-10 DIAGNOSIS — D2262 Melanocytic nevi of left upper limb, including shoulder: Secondary | ICD-10-CM | POA: Diagnosis not present

## 2023-12-22 DIAGNOSIS — G2581 Restless legs syndrome: Secondary | ICD-10-CM | POA: Diagnosis not present

## 2023-12-22 DIAGNOSIS — Z01818 Encounter for other preprocedural examination: Secondary | ICD-10-CM | POA: Diagnosis not present

## 2023-12-22 DIAGNOSIS — E278 Other specified disorders of adrenal gland: Secondary | ICD-10-CM | POA: Diagnosis not present

## 2023-12-22 DIAGNOSIS — Z87442 Personal history of urinary calculi: Secondary | ICD-10-CM | POA: Diagnosis not present

## 2023-12-22 DIAGNOSIS — G459 Transient cerebral ischemic attack, unspecified: Secondary | ICD-10-CM | POA: Diagnosis not present

## 2023-12-22 DIAGNOSIS — J45909 Unspecified asthma, uncomplicated: Secondary | ICD-10-CM | POA: Diagnosis not present

## 2023-12-22 DIAGNOSIS — F419 Anxiety disorder, unspecified: Secondary | ICD-10-CM | POA: Diagnosis not present

## 2023-12-22 DIAGNOSIS — G930 Cerebral cysts: Secondary | ICD-10-CM | POA: Diagnosis not present

## 2023-12-22 DIAGNOSIS — R5382 Chronic fatigue, unspecified: Secondary | ICD-10-CM | POA: Diagnosis not present

## 2024-01-05 ENCOUNTER — Encounter: Payer: Self-pay | Admitting: Family Medicine

## 2024-01-05 ENCOUNTER — Ambulatory Visit: Admitting: Family Medicine

## 2024-01-05 VITALS — BP 121/80 | HR 72 | Resp 16 | Wt 169.0 lb

## 2024-01-05 DIAGNOSIS — R109 Unspecified abdominal pain: Secondary | ICD-10-CM | POA: Diagnosis not present

## 2024-01-05 MED ORDER — MONTELUKAST SODIUM 10 MG PO TABS: 10.0000 mg | ORAL_TABLET | Freq: Every day | ORAL | 3 refills | Status: DC

## 2024-01-05 MED ORDER — FLUTICASONE PROPIONATE 50 MCG/ACT NA SUSP: 2.0000 | Freq: Every day | NASAL | 3 refills | Status: DC

## 2024-01-05 NOTE — Progress Notes (Signed)
 Established patient visit   Patient: Joanne Wade   DOB: 1966/04/30   58 y.o. Female  MRN: [id number] Visit Date: 01/05/2024  Today's healthcare provider: Nancyann Perry, MD   Chief Complaint  Patient presents with   Pain    Left side (mid side to pelvic bone) For several months but now is more frequently. Patient scheduled to have a tumor removed.   Subjective    Discussed the use of AI scribe software for clinical note transcription with the patient, who gave verbal consent to proceed.  History of Present Illness   Joanne Wade is a 58 year old female who presents with left-sided abdominal pain.  She experiences left-sided abdominal pain described as a sharp spasm or pain occurring when lying down or sometimes while sitting, such as in the car. The pain is located under her rib cage and extends downwards, lasting from 30 seconds to a minute and a half. She rates the pain as a 'solid six' on a pain scale when it occurs. The pain has been increasing in frequency over the past couple of months.  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 She states the tumor is approximately 4 cm in size, and she has consulted multiple surgeons who recommended its removal.  She has a history of hip problems for which she has received steroid injections in both hips to alleviate pain, especially at night when lying down. She describes having to rotate positions frequently due to hip pain.  In terms of her gastrointestinal history, she had a colonoscopy in 2019 which revealed diverticulosis. No recent bowel changes or constipation. Her current medications include Singulair  and Flonase , which she requested to be refilled during the visit.       Medications: Outpatient Medications Prior to Visit  Medication Sig   fluticasone  (FLONASE ) 50 MCG/ACT  nasal spray Place 2 sprays into both nostrils daily.   butalbital -acetaminophen -caffeine  (FIORICET , ESGIC ) 50-325-40 MG tablet Take 1 tablet by mouth 2 (two) times daily as needed for headache (Every 6 hours as needed for headache). (Patient not taking: Reported on 01/05/2024)   meloxicam (MOBIC) 15 MG tablet Take 15 mg by mouth daily. (Patient not taking: Reported on 01/05/2024)   OVER THE COUNTER MEDICATION Take 2 tablets by mouth daily. Fresh fruit & vegetable vitamin (Patient not taking: Reported on 01/05/2024)   tirzepatide (MOUNJARO) 2.5 MG/0.5ML Pen Inject 2.5 mg into the skin once a week. (Patient not taking: Reported on 01/05/2024)   [DISCONTINUED] estradiol  (ESTRACE ) 0.1 MG/GM vaginal cream Estrogen Cream Instructions: Discard applicator, Apply pea sized amount to tip of finger to urethra before bed nightly for 1 week, then use Monday, Wednesday and Friday. Wash hands well after application. (Patient not taking: Reported on 01/05/2024)   [DISCONTINUED] MAGNESIUM OXIDE 400 PO Take 400 mg by mouth daily.   [DISCONTINUED] MIEBO 1.338 GM/ML SOLN Apply to eye.   montelukast  (SINGULAIR ) 10 MG tablet TAKE 1 TABLET BY MOUTH AT BEDTIME.   [DISCONTINUED] VEVYE 0.1 % SOLN Apply to eye.   No facility-administered medications prior to visit.   Review of Systems     Objective    BP 121/80 (BP Location: Left Arm, Patient Position: Sitting, Cuff Size: Normal)   Pulse 72   Resp 16   Wt 169 lb (76.7 kg)   SpO2 99%   BMI 27.28  kg/m   Physical Exam  Physical Exam   ABDOMEN: Tenderness in the left upper quadrant under the rib cage and left mid abdomen. No masses.  No costovertebral angle tenderness.       Assessment & Plan        Left-sided abdominal pain Intermittent sharp pain under left rib cage, increasing in frequency. Differential includes muscle spasms, diverticulosis, or referred pain from adrenal tumor. - Order CT scan of abdomen to evaluate cause before adrenal tumor surgery. - Ensure  CT scan results available before January 15, 2024. - Forward CT scan results to Dr. Sheena Query at [facility].  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 Not causing left-sided pain. - Proceed with adrenal tumor surgery on January 15, 2024.  Hip pain Chronic bilateral hip pain managed with steroid injections. Pain worsens when lying down.  Allergic rhinitis She requested refills. - Refill Singulair  and Flonase  prescriptions.    No follow-ups on file.     Nancyann Perry, MD  [facility] [phone] (phone) [phone] (fax)  [facility]

## 2024-01-09 ENCOUNTER — Ambulatory Visit
Admission: RE | Admit: 2024-01-09 | Discharge: 2024-01-09 | Disposition: A | Discharge status: Home / Self Care | Source: Ambulatory Visit | Attending: Family Medicine | Admitting: Family Medicine

## 2024-01-09 DIAGNOSIS — R109 Unspecified abdominal pain: Secondary | ICD-10-CM

## 2024-01-09 DIAGNOSIS — D1771 Benign lipomatous neoplasm of kidney: Secondary | ICD-10-CM | POA: Diagnosis not present

## 2024-01-09 MED ORDER — IOPAMIDOL (ISOVUE-300) INJECTION 61%
100.0000 mL | Freq: Once | INTRAVENOUS | Status: AC | PRN
  Administered 2024-01-09: 100 mL via INTRAVENOUS

## 2024-01-10 ENCOUNTER — Ambulatory Visit: Payer: Self-pay | Admitting: Family Medicine

## 2024-01-15 DIAGNOSIS — D3501 Benign neoplasm of right adrenal gland: Secondary | ICD-10-CM | POA: Diagnosis not present

## 2024-01-15 DIAGNOSIS — E278 Other specified disorders of adrenal gland: Secondary | ICD-10-CM | POA: Diagnosis not present

## 2024-01-15 DIAGNOSIS — D175 Benign lipomatous neoplasm of intra-abdominal organs: Secondary | ICD-10-CM | POA: Diagnosis not present

## 2024-01-30 DIAGNOSIS — Z9889 Other specified postprocedural states: Secondary | ICD-10-CM | POA: Diagnosis not present

## 2024-01-30 DIAGNOSIS — D3501 Benign neoplasm of right adrenal gland: Secondary | ICD-10-CM | POA: Diagnosis not present

## 2024-01-30 DIAGNOSIS — G8918 Other acute postprocedural pain: Secondary | ICD-10-CM | POA: Diagnosis not present

## 2024-04-13 ENCOUNTER — Other Ambulatory Visit: Payer: Self-pay | Admitting: Family Medicine

## 2024-04-15 ENCOUNTER — Encounter: Payer: Self-pay | Admitting: Family Medicine

## 2024-05-05 ENCOUNTER — Ambulatory Visit: Payer: Self-pay

## 2024-05-05 NOTE — Telephone Encounter (Signed)
 Patient needs Singulair  refilled, would like a flu shot if possible at this appointment, and wants her incision site (from July) checked due to tenderness x about two weeks  FYI Only or Action Required?: FYI only for provider: appointment scheduled on 05/06/2024 at 11am with patient's PCP Dr Myrla .  Patient was last seen in primary care on 01/05/2024 by Gasper Nancyann BRAVO, MD.  Called Nurse Triage reporting Post-op Problem.  Symptoms began several weeks ago.  Interventions attempted: Rest, hydration, or home remedies.  Symptoms are: unchanged.  Triage Disposition: Call PCP Within 24 Hours  Patient/caregiver understands and will follow disposition?: Yes                      Copied from CRM #[id number]. Topic: Clinical - Red Word Triage >> May 05, 2024 10:12 AM Tobias CROME wrote: Red Word that prompted transfer to Nurse Triage: Pain where incision is, tender to touch Reason for Disposition  [1] MILD-MODERATE post-op pain (e.g., pain scale 1-7) AND [2] not controlled with pain medications    Patient wants this area checked and she also needs to come in for medication refills.  Answer Assessment - Initial Assessment Questions Patient states she needs her singulair  refilled Patient also would like to get her flu shot done in office at this visit if possible  01/15/2024 Adrenal surgery Past two weeks ---tender to the touch  Patient states she went [place] the beach from the 2nd--9th & patient around incision started about a week prior to going to the beach Up 2 inches and to the right 3 inches this incision site She has massaged the area some as she instructed to help scar tissue  Patient wants to wait until next week Patient offered an appointment for today 05/05/2024 but she couldn't come in today Patient scheduled for tomorrow and she states she will call us  back if she needs to reschedule that appointment as well---she is going to see about changing a hair appointment  time   Patient is advised to call us  back if anything changes or with any further questions/concerns. Patient is advised that if anything worsens to go to the Emergency Room. Patient verbalized understanding.     1. SYMPTOM: What's the main symptom you're concerned about? (e.g., pain, fever, vomiting)     Tenderness at an incision 4. DATE of SURGERY: When was the surgery?      01/15/2024 6. DRAINS: Were any drains place in or around the wound? (e.g., Hemovac, Jackson-Pratt, Penrose)     N/A 7. PAIN: Is there any pain? If Yes, ask: How bad is it?  (Scale 0-10; or none, mild, moderate, severe)     3-4 lingering pain when using abdominal muscles 8. FEVER: Do you have a fever? If Yes, ask: What is your temperature, how was it measured, and when did it start?     denies 9. VOMITING: Is there any vomiting? If Yes, ask: How many times?     denies 10. BLEEDING: Is there any bleeding? If Yes, ask: How much? and Where?       denies  Protocols used: Post-Op Symptoms and Questions-A-AH

## 2024-05-06 ENCOUNTER — Ambulatory Visit: Admitting: Family Medicine

## 2024-05-06 ENCOUNTER — Encounter: Payer: Self-pay | Admitting: Family Medicine

## 2024-05-06 VITALS — BP 113/77 | HR 63 | Ht 66.0 in | Wt 170.4 lb

## 2024-05-06 DIAGNOSIS — J309 Allergic rhinitis, unspecified: Secondary | ICD-10-CM

## 2024-05-06 DIAGNOSIS — R109 Unspecified abdominal pain: Secondary | ICD-10-CM

## 2024-05-06 DIAGNOSIS — Z23 Encounter for immunization: Secondary | ICD-10-CM

## 2024-05-06 DIAGNOSIS — J4520 Mild intermittent asthma, uncomplicated: Secondary | ICD-10-CM

## 2024-05-06 DIAGNOSIS — Z9889 Other specified postprocedural states: Secondary | ICD-10-CM

## 2024-05-06 MED ORDER — FLUTICASONE PROPIONATE 50 MCG/ACT NA SUSP: 2.0000 | Freq: Every day | NASAL | 3 refills | Status: AC

## 2024-05-06 MED ORDER — MONTELUKAST SODIUM 10 MG PO TABS: 10.0000 mg | ORAL_TABLET | Freq: Every day | ORAL | 3 refills | Status: AC

## 2024-05-06 NOTE — Assessment & Plan Note (Signed)
Doing well Refilled Flonase

## 2024-05-06 NOTE — Telephone Encounter (Signed)
 Noted

## 2024-05-06 NOTE — Assessment & Plan Note (Signed)
 Doing well  Refilled singulair 

## 2024-05-06 NOTE — Progress Notes (Signed)
 Established patient visit   Patient: Joanne Wade   DOB: 03-22-1966   58 y.o. Female  MRN: [id number] Visit Date: 05/06/2024  Today's healthcare provider: Jon Eva, MD   Chief Complaint  Patient presents with   Medication Refill    Singular and flonase    Follow-up    recheck incision area from July due to tenderness in area x 2 weeks/Triage RN   Subjective    Medication Refill   HPI     Medication Refill    Additional comments: Singular and flonase         Follow-up    Additional comments: recheck incision area from July due to tenderness in area x 2 weeks/Triage RN      Last edited by Lilian Fitzpatrick, CMA on 05/06/2024 11:10 AM.       Discussed the use of AI scribe software for clinical note transcription with the patient, who gave verbal consent to proceed.  History of Present Illness   Joanne Wade is a 58 year old female who presents with abdominal pain and concerns about her surgical incision following adrenal adenoma surgery.  She underwent adrenal adenoma surgery on January 15, 2024. Initially, she experienced significant post-surgical pain, which improved over two months. Recently, she has noticed a recurrence of pain at the site of her largest incision, worsening over time. The pain is severe, requiring her to 'log roll' out of bed, and is exacerbated by activities such as climbing stairs. She has observed a lump at the incision site, suspecting scar tissue or a hernia.  A week post-surgery, she developed a cough that caused significant discomfort at the incision site, feeling as though it 'exploded inside.' Despite initial improvement, the pain has returned, raising concerns about a possible hernia.  She manages her weight with tirzepatide, using it intermittently due to side effects like fatigue and exhaustion. She has lost about 15 pounds but is considering a lower dose due to the financial investment and some weight loss benefits.  Her social history includes frequent trips to a [employer], involving regular stair climbing, and living with her husband and a small dog, contributing to her physical activity.       Medications: Outpatient Medications Prior to Visit  Medication Sig   butalbital -acetaminophen -caffeine  (FIORICET , ESGIC ) 50-325-40 MG tablet Take 1 tablet by mouth 2 (two) times daily as needed for headache (Every 6 hours as needed for headache).   meloxicam (MOBIC) 15 MG tablet Take 15 mg by mouth daily.   OVER THE COUNTER MEDICATION Take 2 tablets by mouth daily. Fresh fruit & vegetable vitamin   [DISCONTINUED] fluticasone  (FLONASE ) 50 MCG/ACT nasal spray Place 2 sprays into both nostrils daily.   [DISCONTINUED] montelukast  (SINGULAIR ) 10 MG tablet Take 1 tablet (10 mg total) by mouth at bedtime.   [DISCONTINUED] tirzepatide (MOUNJARO) 2.5 MG/0.5ML Pen Inject 2.5 mg into the skin once a week.   No facility-administered medications prior to visit.    Review of Systems     Objective    BP 113/77 (BP Location: Left Arm, Patient Position: Sitting, Cuff Size: Normal)   Pulse 63   Ht 5' 6 (1.676 m)   Wt 170 lb 6.4 oz (77.3 kg)   SpO2 99%   BMI 27.50 kg/m    Physical Exam Vitals reviewed.  Constitutional:      General: She is not in acute distress.    Appearance: Normal appearance. She is well-developed. She is not diaphoretic.  HENT:  Head: Normocephalic and atraumatic.  Eyes:     General: No scleral icterus.    Conjunctiva/sclera: Conjunctivae normal.  Neck:     Thyroid : No thyromegaly.  Cardiovascular:     Rate and Rhythm: Normal rate and regular rhythm.     Heart sounds: Normal heart sounds. No murmur heard. Pulmonary:     Effort: Pulmonary effort is normal. No respiratory distress.     Breath sounds: Normal breath sounds. No wheezing, rhonchi or rales.  Abdominal:     General: Bowel sounds are normal. There is no distension.     Palpations: Abdomen is soft.     Tenderness: There  is no abdominal tenderness.     Comments: Incisions c/d/I, well healed  Musculoskeletal:     Cervical back: Neck supple.     Right lower leg: No edema.     Left lower leg: No edema.  Lymphadenopathy:     Cervical: No cervical adenopathy.  Skin:    General: Skin is warm and dry.     Findings: No rash.  Neurological:     Mental Status: She is alert and oriented to person, place, and time. Mental status is at baseline.  Psychiatric:        Mood and Affect: Mood normal.        Behavior: Behavior normal.      No results found for any visits on 05/06/24.  Assessment & Plan     Problem List Items Addressed This Visit       Respiratory   Uncomplicated asthma   Doing well  Refilled singulair       Relevant Medications   montelukast  (SINGULAIR ) 10 MG tablet   Allergic rhinitis   Doing well Refilled Flonase       Other Visit Diagnoses       Abdominal wall pain    -  Primary     History of adrenal surgery         Immunization due       Relevant Orders   Flu vaccine trivalent PF, 6mos and older(Flulaval,Afluria,Fluarix,Fluzone)          Muscular abdominal pain after adrenalectomy Muscular abdominal pain post-adrenalectomy, likely due to strain from increased physical activity, such as climbing stairs. No evidence of hernia or incisional complications. Pain exacerbated by movement, particularly when getting up from lying down. Internal scar tissue present but not causing significant issues. Pain is muscular in nature, not related to the incision itself. - Use heating pad for pain relief. - Continue massage therapy for scar tissue. - Avoid activities that exacerbate pain, such as excessive stair climbing.  Encounter for immunization Due for flu shot. - Administered flu shot.       Return in about 3 months (around 08/06/2024) for CPE.      I personally spent a total of 28 minutes in the care of the patient today including preparing to see the patient, getting/reviewing  separately obtained history, performing a medically appropriate exam/evaluation, counseling and educating, placing orders, documenting clinical information in the EHR, and coordinating care.   Jon Eva, MD  [facility] [phone] (phone) [phone] (fax)  [facility]

## 2024-07-28 ENCOUNTER — Other Ambulatory Visit: Payer: Self-pay | Admitting: Podiatry

## 2024-08-11 ENCOUNTER — Ambulatory Visit: Admit: 2024-08-11 | Admitting: Podiatry

## 2024-08-16 ENCOUNTER — Encounter: Admitting: Family Medicine
